# Patient Record
Sex: Male | Born: 1955
Health system: Southern US, Community
[De-identification: ages and names within clinical notes are randomized; demographics above are authoritative.]

## PROBLEM LIST (undated history)

## (undated) DIAGNOSIS — R911 Solitary pulmonary nodule: Secondary | ICD-10-CM

## (undated) DIAGNOSIS — B1920 Unspecified viral hepatitis C without hepatic coma: Secondary | ICD-10-CM

## (undated) DIAGNOSIS — N2889 Other specified disorders of kidney and ureter: Secondary | ICD-10-CM

## (undated) DIAGNOSIS — Z9889 Other specified postprocedural states: Secondary | ICD-10-CM

## (undated) DIAGNOSIS — B182 Chronic viral hepatitis C: Principal | ICD-10-CM

## (undated) DIAGNOSIS — F419 Anxiety disorder, unspecified: Secondary | ICD-10-CM

## (undated) DIAGNOSIS — N400 Enlarged prostate without lower urinary tract symptoms: Secondary | ICD-10-CM

## (undated) DIAGNOSIS — E785 Hyperlipidemia, unspecified: Secondary | ICD-10-CM

## (undated) DIAGNOSIS — M1990 Unspecified osteoarthritis, unspecified site: Secondary | ICD-10-CM

## (undated) DIAGNOSIS — R7302 Impaired glucose tolerance (oral): Secondary | ICD-10-CM

## (undated) HISTORY — DX: Benign prostatic hyperplasia without lower urinary tract symptoms: N40.0

## (undated) HISTORY — DX: Unspecified osteoarthritis, unspecified site: M19.90

## (undated) HISTORY — PX: KNEE ARTHROSCOPY: SHX127

## (undated) HISTORY — DX: Solitary pulmonary nodule: R91.1

## (undated) HISTORY — DX: Impaired glucose tolerance (oral): R73.02

## (undated) HISTORY — DX: Hyperlipidemia, unspecified: E78.5

## (undated) HISTORY — DX: Chronic viral hepatitis C: B18.2

## (undated) HISTORY — DX: Other specified postprocedural states: Z98.890

## (undated) HISTORY — DX: Unspecified viral hepatitis C without hepatic coma: B19.20

---

## 1997-10-07 ENCOUNTER — Emergency Department (HOSPITAL_COMMUNITY): Admission: EM | Admit: 1997-10-07 | Discharge: 1997-10-07 | Payer: Self-pay | Admitting: Emergency Medicine

## 1999-01-30 ENCOUNTER — Ambulatory Visit (HOSPITAL_COMMUNITY): Admission: RE | Admit: 1999-01-30 | Discharge: 1999-01-30 | Payer: Self-pay | Admitting: Gastroenterology

## 1999-01-30 ENCOUNTER — Encounter (INDEPENDENT_AMBULATORY_CARE_PROVIDER_SITE_OTHER): Payer: Self-pay | Admitting: Specialist

## 2001-11-19 ENCOUNTER — Emergency Department (HOSPITAL_COMMUNITY): Admission: EM | Admit: 2001-11-19 | Discharge: 2001-11-19 | Payer: Self-pay | Admitting: Emergency Medicine

## 2001-11-21 ENCOUNTER — Ambulatory Visit (HOSPITAL_COMMUNITY): Admission: RE | Admit: 2001-11-21 | Discharge: 2001-11-21 | Payer: Self-pay | Admitting: Gastroenterology

## 2002-04-12 ENCOUNTER — Emergency Department (HOSPITAL_COMMUNITY): Admission: EM | Admit: 2002-04-12 | Discharge: 2002-04-12 | Payer: Self-pay

## 2004-02-01 ENCOUNTER — Emergency Department (HOSPITAL_COMMUNITY): Admission: EM | Admit: 2004-02-01 | Discharge: 2004-02-01 | Payer: Self-pay | Admitting: Emergency Medicine

## 2004-05-19 ENCOUNTER — Ambulatory Visit (HOSPITAL_COMMUNITY): Admission: RE | Admit: 2004-05-19 | Discharge: 2004-05-19 | Payer: Self-pay | Admitting: Specialist

## 2004-06-08 ENCOUNTER — Ambulatory Visit (HOSPITAL_BASED_OUTPATIENT_CLINIC_OR_DEPARTMENT_OTHER): Admission: RE | Admit: 2004-06-08 | Discharge: 2004-06-08 | Payer: Self-pay | Admitting: Specialist

## 2007-07-28 ENCOUNTER — Emergency Department (HOSPITAL_COMMUNITY): Admission: EM | Admit: 2007-07-28 | Discharge: 2007-07-28 | Payer: Self-pay | Admitting: Emergency Medicine

## 2007-08-20 ENCOUNTER — Ambulatory Visit (HOSPITAL_COMMUNITY): Admission: RE | Admit: 2007-08-20 | Discharge: 2007-08-20 | Payer: Self-pay | Admitting: Gastroenterology

## 2007-08-20 ENCOUNTER — Encounter (INDEPENDENT_AMBULATORY_CARE_PROVIDER_SITE_OTHER): Payer: Self-pay | Admitting: Gastroenterology

## 2007-11-22 ENCOUNTER — Emergency Department (HOSPITAL_COMMUNITY): Admission: EM | Admit: 2007-11-22 | Discharge: 2007-11-22 | Payer: Self-pay | Admitting: Emergency Medicine

## 2007-12-02 ENCOUNTER — Encounter (INDEPENDENT_AMBULATORY_CARE_PROVIDER_SITE_OTHER): Payer: Self-pay | Admitting: Orthopedic Surgery

## 2007-12-02 ENCOUNTER — Ambulatory Visit (HOSPITAL_BASED_OUTPATIENT_CLINIC_OR_DEPARTMENT_OTHER): Admission: RE | Admit: 2007-12-02 | Discharge: 2007-12-02 | Payer: Self-pay | Admitting: Orthopedic Surgery

## 2008-10-20 ENCOUNTER — Emergency Department (HOSPITAL_COMMUNITY): Admission: EM | Admit: 2008-10-20 | Discharge: 2008-10-20 | Payer: Self-pay | Admitting: Family Medicine

## 2010-08-15 NOTE — Op Note (Signed)
Adam [name], Adam [name]              ACCOUNT NO.:  [id number]   MEDICAL RECORD NO.:  [id number]          PATIENT TYPE:  AMB   LOCATION:  DSC                          FACILITY:  [facility]   PHYSICIAN:  Cindee Salt, M.D.       DATE OF BIRTH:  Mar 14, 1956   DATE OF PROCEDURE:  12/02/2007  DATE OF DISCHARGE:                               OPERATIVE REPORT   PREOPERATIVE DIAGNOSIS:  Status post laceration, right middle finger  metacarpophalangeal joint area.   POSTOPERATIVE DIAGNOSIS:  Status post laceration, right middle finger  metacarpophalangeal joint area.   OPERATION:  Incision and drainage, debridement of laceration, right  middle finger.   SURGEON:  Cindee Salt, MD   ANESTHESIA:  Forearm based IV regional.   HISTORY:  The patient is a 55 year old male who suffered a laceration  over the dorsal aspect of his right hand, metacarpophalangeal joint  middle finger approximately 2 weeks ago.  He has had progressive  swelling of the area and pain with extension.  He is admitted now for  exploration of the extensor tendon and debridement.  He is well aware of  risks and complications including infection, recurrence of injury to  arteries, nerves, tendons, complete relief of symptoms, dystrophy,  scarring in the area, and possibility of damage to the joint.  In the  preoperative area, the patient is seen.  The extremity marked by both  the patient and surgeon and antibiotic given.   PROCEDURE:  The patient was brought to the operating room where a  forearm based IV regional anesthetic was carried out without difficulty  under the direction of Dr. Jean Rosenthal.  He was prepped using DuraPrep,  supine position with the right arm free.  The limb was exsanguinated  with an Esmarch bandage and tourniquet having been inflated.  The time-  out was taken.  A curvilinear incision was made over the dorsal aspect  of the metacarpal, carried onto the proximal phalanx, carried down  through subcutaneous  tissue.  No gross purulence was encountered.  The  extensor tendon was noted.  There was significant granulation tissue  present dorsal to this.  This was a markedly thickened caseous looking  heavy granulation-type tissue.  This was debrided.  Cultures were taken  for both aerobic, anaerobic, fungal, and atypical mycobacterial  infection.  Biopsies were taken.  This was sent for regular pathology  and for culture.  The area was irrigated.  The extensor tendon was  entirely intact and this did not enter into the joint.  The wound was  irrigated.  The skin closed with interrupted 4-0 Vicryl Rapide sutures.  Sterile compressive dressing and splint to the hand applied.  The  patient tolerated the procedure well and was taken to the recovery room  for observation in satisfactory condition.  He will be discharged home  to return to the [facility] in 1 week on Vicodin and  doxycycline.           ______________________________  Cindee Salt, M.D.     GK/MEDQ  D:  12/02/2007  T:  12/03/2007  Job:  [id number] 

## 2010-08-15 NOTE — Op Note (Signed)
[name], Adam              ACCOUNT NO.:  [id number]   MEDICAL RECORD NO.:  [id number]          PATIENT TYPE:  AMB   LOCATION:  ENDO                         FACILITY:  [facility]   PHYSICIAN:  Anselmo Rod, M.D.  DATE OF BIRTH:  01/27/1956   DATE OF PROCEDURE:  08/20/2007  DATE OF DISCHARGE:  08/20/2007                               OPERATIVE REPORT   PROCEDURE PERFORMED:  Colonoscopy with cold biopsies x 3.   ENDOSCOPIST:  Anselmo Rod, M.D.   INSTRUMENT USED:  Pentax video colonoscope.   INDICATIONS FOR PROCEDURE:  55 year old male undergoing screening  colonoscopy to rule out colonic polyps, masses, etc.   PREPROCEDURE PREPARATION:  Informed consent was procured from the  patient.  The patient fasted for 8 hours prior to the procedure and  prepped with a bottle of magnesium citrate and a gallon of NuLytely the  night prior to the procedure.  The risks and benefits of the procedure  including a 10% miss rate of cancer and polyps were discussed with the  patient as well.   PREPROCEDURE PHYSICAL:  The patient had stable vital signs.  Neck  supple.  Chest clear to auscultation.  S1, S2 regular.  Abdomen soft  with normal bowel sounds.   DESCRIPTION OF PROCEDURE:  The patient was placed in left lateral  decubitus position and sedated with 75 mcg of Fentanyl and 7 mg of  Versed given intravenously in slow incremental doses. Once the patient  was adequately sedated and maintained on low-flow oxygen and continuous  cardiac monitoring, the Pentax video colonoscope was advanced from the  rectum to cecum.  The appendiceal orifice and ileocecal valve were  clearly visualized and photographed.  The terminal ileum appeared  healthy without lesions.  A small sessile polyp was removed from the  cecum by cold biopsies x1.  Another small sessile polyp was removed from  the rectum with cold biopsies x2.  Small internal hemorrhoids were seen  on retroflexion.  The rest of the exam was  unremarkable.  The patient  tolerated the procedure well without immediate complications.   IMPRESSION:  1. Small internal hemorrhoids.  2. Small sessile polyp removed by cold biopsies from the rectum.  3. Small sessile polyp removed from the cecum with cold biopsies x 1.  4. Otherwise normal exam.  No evidence of diverticulosis.   RECOMMENDATIONS:  1. Await pathology results.  2. Avoid all nonsteroidals including aspirin for the next 3 weeks.  3. Repeat colonoscopy depending on pathology results.  4. Outpatient follow-up as need arises in the future.      Anselmo Rod, M.D.  Electronically Signed     JNM/MEDQ  D:  08/25/2007  T:  08/26/2007  Job:  [id number]   cc:   Donia Guiles, M.D.  Fax: [phone]

## 2010-08-18 NOTE — Op Note (Signed)
NAMEDELDRICK, Adam [name]              ACCOUNT NO.:  [id number]   MEDICAL RECORD NO.:  [id number]          PATIENT TYPE:  AMB   LOCATION:  NESC                         FACILITY:  [facility]   PHYSICIAN:  Jene Every, M.D.    DATE OF BIRTH:  1955-04-19   DATE OF PROCEDURE:  06/08/2004  DATE OF DISCHARGE:                                 OPERATIVE REPORT   PREOPERATIVE DIAGNOSIS:  Medial meniscus tear with spotty chondromalacia,  right knee.   POSTOPERATIVE DIAGNOSIS:  1.  Grade 3 chondromalacia of the medial femoral condyle, tibial plateau.  2.  Small grade 4 lesion of the tibial plateau medially.  3.  Medial meniscus tear with grade 3 chondromalacia of the patella.   PROCEDURE:  1.  Right knee arthroscopy.  2.  Partial medial meniscectomy.  3.  Removal of loose body.  4.  Chondroplasty of the medial femoral condyle, tibial plateau, and      patella.   ANESTHESIA:  General.   SURGEON:  Jene Every, M.D.   ASSISTANT:  None.   INDICATIONS FOR PROCEDURE:  This is a 55 year old with refractory right knee  pain, locking, and giving way.  MRI indicating meniscus tear, loose body.  Operative intervention is indicated for diagnosis and treatment by  diagnostic arthroscopy.  The risks and benefits were discussed, including  bleeding, infection, damage to neurovascular structures, no change in  symptoms, worsening of symptoms, need for repeat debridement in the future,  etc.   DESCRIPTION OF PROCEDURE:  With the patient in the supine position after  induction of general anesthesia and 1 g of Kefzol, the right lower extremity  was prepped and draped in the usual sterile fashion.   A lateral parapatellar portal and superomedial parapatellar portal was  fashioned with a #11 blade.  Ingress cannula atraumatically placed.  Irrigant was utilized to insufflate the joint.  Arthroscopic camera then  inserted laterally.  Inspection of the medial compartment revealed extensive  grade 3 chondral  flap tears and degenerative changes of the medial femoral  condyle.  A shaver was introduced after a medial parapatellar portal was  fashioned with a #11 blade and localized with an 18-gauge needle, sparring  the medial meniscus.  Chondroplasty of the medial femoral condyle was  performed.  A complex tear of the posterior portion of the medial meniscus  was identified and resected with a straight basket rongeur and further  contoured to a stable base with the 42 Kuda shaver.  The remnant,  approximately 50%, was stable to probe or palpation.  There was a small  grade 4 lesion over the posterior tibial plateau on the nonweightbearing  section, and immediately beside that underneath the meniscus, a small loose  body was retrieved with the pituitary approximately a centimeter in  diameter.  Cartilaginous in nature.  The remnant was stable to probe and  palpation.  The chondroplasty had been performed.  Next, examination of the  ACL and PCL revealed that they were unremarkable.  The lateral compartment  was essentially unremarkable.  There were some minor grade 2 changes of the  femoral condyle and  tibial plateau.  The meniscus was stable to probe and  palpation, without evidence of a tear.  There were grade 3 changes of the  patella.  There was normal patellofemoral tracking, and we performed a  chondroplasty of the patella as well.  The gutters were unremarkable as  well.  We copiously lavaged the knee and reexamined all compartments  specifically, as well, the medial compartment again.  The remnant meniscus  was stable to probe and palpation, without evidence of additional pathology  amenable to arthroscopic intervention.  We copiously lavaged the knee.  All  instrumentation was removed.  The portals were closed with 4-0 nylon simple  suture.  Marcaine 0.25% with epinephrine was infiltrated in the joint.  The  wound was dressed sterilely.   He was awakened without difficulty and transported  to recovery in  satisfactory condition.  The patient tolerated the procedure well. There  were no complications.      JB/MEDQ  D:  06/08/2004  T:  06/08/2004  Job:  [id number]

## 2011-01-03 LAB — FUNGUS CULTURE W SMEAR

## 2011-01-03 LAB — AFB CULTURE WITH SMEAR (NOT AT ARMC)
Acid Fast Smear: NONE SEEN
Acid Fast Smear: NONE SEEN

## 2011-01-03 LAB — TISSUE CULTURE: Gram Stain: NONE SEEN

## 2011-01-03 LAB — ANAEROBIC CULTURE: Gram Stain: NONE SEEN

## 2011-01-03 LAB — POCT HEMOGLOBIN-HEMACUE: Hemoglobin: 15.3

## 2013-01-17 ENCOUNTER — Emergency Department (HOSPITAL_COMMUNITY)
Admission: EM | Admit: 2013-01-17 | Discharge: 2013-01-17 | Disposition: A | Discharge status: Home / Self Care | Payer: 59 | Attending: Emergency Medicine | Admitting: Emergency Medicine

## 2013-01-17 ENCOUNTER — Emergency Department (HOSPITAL_COMMUNITY): Payer: 59

## 2013-01-17 ENCOUNTER — Encounter (HOSPITAL_COMMUNITY): Payer: Self-pay | Admitting: Emergency Medicine

## 2013-01-17 DIAGNOSIS — Z23 Encounter for immunization: Secondary | ICD-10-CM | POA: Insufficient documentation

## 2013-01-17 DIAGNOSIS — S43101A Unspecified dislocation of right acromioclavicular joint, initial encounter: Secondary | ICD-10-CM

## 2013-01-17 DIAGNOSIS — S43109A Unspecified dislocation of unspecified acromioclavicular joint, initial encounter: Secondary | ICD-10-CM | POA: Insufficient documentation

## 2013-01-17 DIAGNOSIS — IMO0002 Reserved for concepts with insufficient information to code with codable children: Secondary | ICD-10-CM | POA: Insufficient documentation

## 2013-01-17 DIAGNOSIS — Y92410 Unspecified street and highway as the place of occurrence of the external cause: Secondary | ICD-10-CM | POA: Insufficient documentation

## 2013-01-17 DIAGNOSIS — Z880 Allergy status to penicillin: Secondary | ICD-10-CM | POA: Insufficient documentation

## 2013-01-17 DIAGNOSIS — Y9389 Activity, other specified: Secondary | ICD-10-CM | POA: Insufficient documentation

## 2013-01-17 MED ORDER — TETANUS-DIPHTH-ACELL PERTUSSIS 5-2.5-18.5 LF-MCG/0.5 IM SUSP
0.5000 mL | Freq: Once | INTRAMUSCULAR | Status: AC
  Administered 2013-01-17: 0.5 mL via INTRAMUSCULAR
  Filled 2013-01-17: qty 0.5

## 2013-01-17 MED ORDER — DIPHENHYDRAMINE HCL 50 MG/ML IJ SOLN
12.5000 mg | Freq: Once | INTRAMUSCULAR | Status: AC
  Administered 2013-01-17: 12.5 mg via INTRAVENOUS
  Filled 2013-01-17: qty 1

## 2013-01-17 MED ORDER — MORPHINE SULFATE 4 MG/ML IJ SOLN
4.0000 mg | INTRAMUSCULAR | Status: AC | PRN
  Administered 2013-01-17 (×3): 4 mg via INTRAVENOUS
  Filled 2013-01-17 (×3): qty 1

## 2013-01-17 MED ORDER — HYDROCODONE-ACETAMINOPHEN 5-325 MG PO TABS: 1.0000 | ORAL_TABLET | ORAL | Status: DC | PRN

## 2013-01-17 MED ORDER — ONDANSETRON HCL 4 MG/2ML IJ SOLN
4.0000 mg | Freq: Once | INTRAMUSCULAR | Status: AC | PRN
  Administered 2013-01-17: 4 mg via INTRAVENOUS
  Filled 2013-01-17: qty 2

## 2013-01-17 NOTE — ED Notes (Signed)
Pt reported swollen area above Iv site.  Skin is red , no itching . Pt does not report an ALL to Morphine. Reported to EDP Knapp.

## 2013-01-17 NOTE — Progress Notes (Signed)
Orthopedic Tech Progress Note Patient Details:  CLIFFARD HAIR 09-02-1955 [id number]  Ortho Devices Type of Ortho Device: Shoulder immobilizer;Thumb velcro splint Ortho Device/Splint Location: sling (RUE) LUE thumb spica Ortho Device/Splint Interventions: Ordered;Application   Jennye Moccasin 01/17/2013, 8:10 PM

## 2013-01-17 NOTE — ED Notes (Signed)
Patient returned from XR. 
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
## 2013-09-11 ENCOUNTER — Encounter (HOSPITAL_COMMUNITY): Payer: Self-pay | Admitting: Emergency Medicine

## 2013-09-11 ENCOUNTER — Emergency Department (HOSPITAL_COMMUNITY)
Admission: EM | Admit: 2013-09-11 | Discharge: 2013-09-12 | Disposition: A | Discharge status: Home / Self Care | Payer: 59 | Attending: Emergency Medicine | Admitting: Emergency Medicine

## 2013-09-11 DIAGNOSIS — Y9389 Activity, other specified: Secondary | ICD-10-CM | POA: Insufficient documentation

## 2013-09-11 DIAGNOSIS — Z880 Allergy status to penicillin: Secondary | ICD-10-CM | POA: Insufficient documentation

## 2013-09-11 DIAGNOSIS — S298XXA Other specified injuries of thorax, initial encounter: Secondary | ICD-10-CM | POA: Insufficient documentation

## 2013-09-11 DIAGNOSIS — W19XXXA Unspecified fall, initial encounter: Secondary | ICD-10-CM

## 2013-09-11 DIAGNOSIS — W1789XA Other fall from one level to another, initial encounter: Secondary | ICD-10-CM | POA: Insufficient documentation

## 2013-09-11 DIAGNOSIS — Y929 Unspecified place or not applicable: Secondary | ICD-10-CM | POA: Insufficient documentation

## 2013-09-11 DIAGNOSIS — R0781 Pleurodynia: Secondary | ICD-10-CM

## 2013-09-11 MED ORDER — OXYCODONE-ACETAMINOPHEN 5-325 MG PO TABS
2.0000 | ORAL_TABLET | Freq: Once | ORAL | Status: AC
  Administered 2013-09-11: 2 via ORAL
  Filled 2013-09-11: qty 2

## 2013-09-11 NOTE — ED Notes (Addendum)
Pt reports he fell over a 6 foot fence last night landing on his back. Pt reports pain in  L lateral ribs when he coughs. Denies loss of urine or bowel. Lung sounds clear in all fields. No distress noted at this time. Skin warm and dry.
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
## 2013-09-12 ENCOUNTER — Emergency Department (HOSPITAL_COMMUNITY): Payer: 59

## 2013-09-12 MED ORDER — OXYCODONE-ACETAMINOPHEN 5-325 MG PO TABS: 1.0000 | ORAL_TABLET | Freq: Four times a day (QID) | ORAL | Status: DC | PRN

## 2013-09-12 NOTE — Discharge Instructions (Signed)
Take pain medication as needed for pain.  Do not drive or operate heavy machinery for 4 hours after taking the medication.

## 2013-09-14 NOTE — ED Provider Notes (Signed)
Medical screening examination/treatment/procedure(s) were performed by non-physician practitioner and as supervising physician I was immediately available for consultation/collaboration.   EKG Interpretation None        Osvaldo Shipper, MD 09/14/13 [phone]

## 2014-06-28 ENCOUNTER — Other Ambulatory Visit (HOSPITAL_COMMUNITY): Payer: Self-pay | Admitting: Nurse Practitioner

## 2014-06-28 DIAGNOSIS — B182 Chronic viral hepatitis C: Secondary | ICD-10-CM

## 2014-07-15 ENCOUNTER — Ambulatory Visit (HOSPITAL_COMMUNITY)
Admission: RE | Admit: 2014-07-15 | Discharge: 2014-07-15 | Disposition: A | Discharge status: Home / Self Care | Payer: 59 | Source: Ambulatory Visit | Attending: Nurse Practitioner | Admitting: Nurse Practitioner

## 2014-07-15 DIAGNOSIS — B182 Chronic viral hepatitis C: Secondary | ICD-10-CM | POA: Insufficient documentation

## 2014-07-15 DIAGNOSIS — K8020 Calculus of gallbladder without cholecystitis without obstruction: Secondary | ICD-10-CM | POA: Insufficient documentation

## 2014-08-16 ENCOUNTER — Ambulatory Visit (INDEPENDENT_AMBULATORY_CARE_PROVIDER_SITE_OTHER): Payer: 59 | Admitting: Infectious Disease

## 2014-08-16 ENCOUNTER — Encounter: Payer: Self-pay | Admitting: Infectious Disease

## 2014-08-16 VITALS — BP 145/91 | HR 74 | Temp 98.1°F | Ht 73.0 in | Wt 167.0 lb

## 2014-08-16 DIAGNOSIS — Z23 Encounter for immunization: Secondary | ICD-10-CM

## 2014-08-16 DIAGNOSIS — Z9889 Other specified postprocedural states: Secondary | ICD-10-CM

## 2014-08-16 DIAGNOSIS — B182 Chronic viral hepatitis C: Secondary | ICD-10-CM | POA: Diagnosis not present

## 2014-08-16 DIAGNOSIS — E785 Hyperlipidemia, unspecified: Secondary | ICD-10-CM

## 2014-08-16 DIAGNOSIS — I10 Essential (primary) hypertension: Secondary | ICD-10-CM

## 2014-08-16 HISTORY — DX: Other specified postprocedural states: Z98.890

## 2014-08-16 HISTORY — DX: Chronic viral hepatitis C: B18.2

## 2014-08-16 HISTORY — DX: Hyperlipidemia, unspecified: E78.5

## 2014-08-16 MED ORDER — LEDIPASVIR-SOFOSBUVIR 90-400 MG PO TABS: 1.0000 | ORAL_TABLET | Freq: Every day | ORAL | Status: DC

## 2014-08-16 MED ORDER — RIBAVIRIN 200 MG PO CAPS: 600.0000 mg | ORAL_CAPSULE | Freq: Every day | ORAL | Status: DC

## 2014-08-16 NOTE — Addendum Note (Signed)
Addended by: Landis Gandy on: 08/16/2014 04:06 PM   Modules accepted: Orders

## 2014-08-16 NOTE — Progress Notes (Signed)
Patient ID: Adam [name], male   DOB: 1955-10-19, 59 y.o.   MRN: [id number] HPI: Adam [name] is a 59 y.o. male who is here to be seen for his hep C before he can get his meds.   No results found for: HCVGENOTYPE, HEPCGENOTYPE  Allergies: No Active Allergies  Vitals: Temp: 98.1 F (36.7 C) (05/16 1110) Temp Source: Oral (05/16 1110) BP: 145/91 mmHg (05/16 1110) Pulse Rate: 74 (05/16 1110)  Past Medical History: Past Medical History  Diagnosis Date  . Chronic hepatitis C without hepatic coma 08/16/2014  . HTN (hypertension) 08/16/2014  . H/O knee surgery 08/16/2014    Social History: History   Social History  . Marital Status: Married    Spouse Name: N/A  . Number of Children: N/A  . Years of Education: N/A   Social History Main Topics  . Smoking status: Never Smoker   . Smokeless tobacco: Never Used  . Alcohol Use: No  . Drug Use: Yes    Special: Marijuana  . Sexual Activity: Yes   Other Topics Concern  . None   Social History Narrative    Labs: No results found for: HIV1RNAQUANT, HIV1RNAVL, CD4TABS, HEPBSAB, HEPBSAG, HCVAB  No results found for: HCVGENOTYPE, HEPCGENOTYPE  No flowsheet data found.  No results found for: AST, ALT, INR  CrCl: CrCl cannot be calculated (Patient has no serum creatinine result on file.).  Fibrosis Score: F2/3 as assessed by [facility]  Child-Pugh Score:   Previous Treatment Regimen: Interferon x 48 wks  Assessment: Pt was seen by [facility] liver clinic for his Hep C. He denied IV drug use and ETOH. He had both Fibrosure (F3) and Elastography (F2/3) done. He started that he was on interferon x 48 weeks without riba around 2006/2007. He didn't get cure from it. He prescribed Harvoni x 12 wks but we are going to add low dose ribavirin since he is treatment experience even if the guideline doesn't require it. Adding low dose ribavirin will increase the response rate even more. He is totally agree to do it.    Recommendations: Harvoni 1 PO qday Ribavirin 600mg  PO qday He is going to f/u with [facility] clinic upstairs  Onnie Boer [place], [place].D., BCPS, AAHIVP Clinical Infectious [place] [facility] 08/16/2014, 11:56 AM

## 2014-08-16 NOTE — Progress Notes (Signed)
   Subjective:    Patient ID: Adam [name], male    DOB: 1955-06-24, 59 y.o.   MRN: [id number]  HPI  59 year old with Chronic hepatitis C without hepatic coma who had liver biopsy in 2000 with firbrosis score F1, genotype 1a.   He was treated apparently with only IFN in 2007 and failed  He has been seen by [place] from [facility] and was found by Fibrosure to be F2-F3 and via elastography to be F2-F3.  He has been given rx for Harvoni but being a [employer] employee he will benefit from being seen here once and to have the script written by [facility] provider and meds filled at [facility].    Review of Systems  Constitutional: Negative for fever, chills, diaphoresis, activity change, appetite change, fatigue and unexpected weight change.  HENT: Negative for congestion, rhinorrhea, sinus pressure, sneezing, sore throat and trouble swallowing.   Eyes: Negative for photophobia and visual disturbance.  Respiratory: Negative for cough, chest tightness, shortness of breath, wheezing and stridor.   Cardiovascular: Negative for chest pain, palpitations and leg swelling.  Gastrointestinal: Negative for nausea, vomiting, abdominal pain, diarrhea, constipation, blood in stool, abdominal distention and anal bleeding.  Genitourinary: Negative for dysuria, hematuria, flank pain and difficulty urinating.  Musculoskeletal: Negative for myalgias, back pain, joint swelling, arthralgias and gait problem.  Skin: Negative for color change, pallor, rash and wound.  Neurological: Negative for dizziness, tremors, weakness and light-headedness.  Hematological: Negative for adenopathy. Does not bruise/bleed easily.  Psychiatric/Behavioral: Negative for behavioral problems, confusion, sleep disturbance, dysphoric mood, decreased concentration and agitation.       Objective:   Physical Exam  Constitutional: He is oriented to person, place, and time. He appears well-developed and  well-nourished.  HENT:  Head: Normocephalic and atraumatic.  Eyes: Conjunctivae and EOM are normal.  Neck: Normal range of motion. Neck supple.  Cardiovascular: Normal rate and regular rhythm.   Pulmonary/Chest: Effort normal. No respiratory distress. He has no wheezes.  Abdominal: Soft. He exhibits no distension.  Musculoskeletal: Normal range of motion. He exhibits no edema or tenderness.  Neurological: He is alert and oriented to person, place, and time.  Skin: Skin is warm and dry. No rash noted. No erythema. No pallor.  Psychiatric: He has a normal mood and affect. His behavior is normal. Judgment and thought content normal.          Assessment & Plan:   Chronic hepatitis C without hepatic coma genotype 1b, F2-F3 by elastography. Reviewed case with pt and MInh Pham our pharmacist and we decided ultimatley to add ribavirin to his Harvoni in case his liver disease is worse than it seems by elastography and he actually has cirrhosis  Rx sent to [facility]. H e needs Hep B vax so we gave him #1 here. I do not know if he has Hep A need for vax. He states he was tested HIV negative by Hepatology  I spent greater than 45 minutes with the patient including greater than 50% of time in face to face counsel of the patient re how to take meds, condition of his liver  and in coordination of their care with pharmacy  HTN: bp a bit, will defer to PCP  Hyperlipidemia; on statin and followed by PCP

## 2014-09-16 ENCOUNTER — Other Ambulatory Visit: Payer: 59

## 2014-09-16 ENCOUNTER — Ambulatory Visit: Payer: 59

## 2014-10-06 ENCOUNTER — Ambulatory Visit: Payer: 59 | Admitting: Infectious Disease

## 2015-02-16 ENCOUNTER — Ambulatory Visit: Payer: 59

## 2015-02-17 ENCOUNTER — Ambulatory Visit: Payer: 59

## 2015-05-02 DIAGNOSIS — B182 Chronic viral hepatitis C: Secondary | ICD-10-CM | POA: Diagnosis not present

## 2015-05-06 DIAGNOSIS — B182 Chronic viral hepatitis C: Secondary | ICD-10-CM | POA: Diagnosis not present

## 2015-05-16 MED FILL — ATORVASTATIN 20 MG TABLET: 20 | 90 days supply | Qty: 90 | Fill #1

## 2015-06-02 DIAGNOSIS — R972 Elevated prostate specific antigen [PSA]: Secondary | ICD-10-CM | POA: Diagnosis not present

## 2015-06-02 DIAGNOSIS — E7800 Pure hypercholesterolemia, unspecified: Secondary | ICD-10-CM | POA: Diagnosis not present

## 2015-06-02 DIAGNOSIS — Z0000 Encounter for general adult medical examination without abnormal findings: Secondary | ICD-10-CM | POA: Diagnosis not present

## 2015-06-02 DIAGNOSIS — Z8619 Personal history of other infectious and parasitic diseases: Secondary | ICD-10-CM | POA: Diagnosis not present

## 2015-06-16 DIAGNOSIS — R351 Nocturia: Secondary | ICD-10-CM | POA: Diagnosis not present

## 2015-06-16 DIAGNOSIS — R3129 Other microscopic hematuria: Secondary | ICD-10-CM | POA: Diagnosis not present

## 2015-06-16 DIAGNOSIS — R972 Elevated prostate specific antigen [PSA]: Secondary | ICD-10-CM | POA: Diagnosis not present

## 2015-06-16 DIAGNOSIS — Z0000 Encounter for general adult medical examination without abnormal findings: Secondary | ICD-10-CM | POA: Diagnosis not present

## 2015-06-16 DIAGNOSIS — N401 Enlarged prostate with lower urinary tract symptoms: Secondary | ICD-10-CM | POA: Diagnosis not present

## 2015-08-11 ENCOUNTER — Encounter (HOSPITAL_COMMUNITY): Payer: Self-pay | Admitting: Emergency Medicine

## 2015-08-11 ENCOUNTER — Observation Stay (HOSPITAL_COMMUNITY)
Admission: EM | Admit: 2015-08-11 | Discharge: 2015-08-13 | Disposition: A | Discharge status: Home / Self Care | Payer: 59 | Attending: General Surgery | Admitting: General Surgery

## 2015-08-11 DIAGNOSIS — I10 Essential (primary) hypertension: Secondary | ICD-10-CM | POA: Diagnosis not present

## 2015-08-11 DIAGNOSIS — D72829 Elevated white blood cell count, unspecified: Secondary | ICD-10-CM | POA: Diagnosis not present

## 2015-08-11 DIAGNOSIS — M1990 Unspecified osteoarthritis, unspecified site: Secondary | ICD-10-CM | POA: Insufficient documentation

## 2015-08-11 DIAGNOSIS — E785 Hyperlipidemia, unspecified: Secondary | ICD-10-CM | POA: Diagnosis not present

## 2015-08-11 DIAGNOSIS — B182 Chronic viral hepatitis C: Secondary | ICD-10-CM | POA: Diagnosis not present

## 2015-08-11 DIAGNOSIS — E876 Hypokalemia: Secondary | ICD-10-CM | POA: Diagnosis not present

## 2015-08-11 DIAGNOSIS — Z791 Long term (current) use of non-steroidal anti-inflammatories (NSAID): Secondary | ICD-10-CM | POA: Insufficient documentation

## 2015-08-11 DIAGNOSIS — K8010 Calculus of gallbladder with chronic cholecystitis without obstruction: Secondary | ICD-10-CM | POA: Diagnosis not present

## 2015-08-11 DIAGNOSIS — Z79899 Other long term (current) drug therapy: Secondary | ICD-10-CM | POA: Insufficient documentation

## 2015-08-11 DIAGNOSIS — K810 Acute cholecystitis: Secondary | ICD-10-CM

## 2015-08-11 DIAGNOSIS — R109 Unspecified abdominal pain: Secondary | ICD-10-CM | POA: Diagnosis present

## 2015-08-11 LAB — COMPREHENSIVE METABOLIC PANEL
ALBUMIN: 4.2 g/dL (ref 3.5–5.0)
ALT: 17 U/L (ref 17–63)
ANION GAP: 16 — AB (ref 5–15)
AST: 23 U/L (ref 15–41)
Alkaline Phosphatase: 78 U/L (ref 38–126)
BUN: 9 mg/dL (ref 6–20)
CHLORIDE: 101 mmol/L (ref 101–111)
CO2: 22 mmol/L (ref 22–32)
Calcium: 9.7 mg/dL (ref 8.9–10.3)
Creatinine, Ser: 0.95 mg/dL (ref 0.61–1.24)
GFR calc Af Amer: >60 mL/min (ref >60)
GFR calc non Af Amer: >60 mL/min (ref >60)
GLUCOSE: 156 mg/dL — AB (ref 65–99)
POTASSIUM: 3.4 mmol/L — AB (ref 3.5–5.1)
SODIUM: 139 mmol/L (ref 135–145)
Total Bilirubin: 1 mg/dL (ref 0.3–1.2)
Total Protein: 8.9 g/dL — ABNORMAL HIGH (ref 6.5–8.1)

## 2015-08-11 LAB — CBC
HEMATOCRIT: 43.8 % (ref 39.0–52.0)
HEMOGLOBIN: 14.5 g/dL (ref 13.0–17.0)
MCH: 25.3 pg — ABNORMAL LOW (ref 26.0–34.0)
MCHC: 33.1 g/dL (ref 30.0–36.0)
MCV: 76.3 fL — AB (ref 78.0–100.0)
Platelets: 217 10*3/uL (ref 150–400)
RBC: 5.74 MIL/uL (ref 4.22–5.81)
RDW: 13.9 % (ref 11.5–15.5)
WBC: 15.1 10*3/uL — ABNORMAL HIGH (ref 4.0–10.5)

## 2015-08-11 LAB — URINE MICROSCOPIC-ADD ON

## 2015-08-11 LAB — URINALYSIS, ROUTINE W REFLEX MICROSCOPIC
BILIRUBIN URINE: NEGATIVE
GLUCOSE, UA: NEGATIVE mg/dL
KETONES UR: NEGATIVE mg/dL
Leukocytes, UA: NEGATIVE
NITRITE: NEGATIVE
Protein, ur: NEGATIVE mg/dL
SPECIFIC GRAVITY, URINE: 1.02 (ref 1.005–1.030)
pH: 6.5 (ref 5.0–8.0)

## 2015-08-11 LAB — LIPASE, BLOOD: LIPASE: 50 U/L (ref 11–51)

## 2015-08-11 NOTE — ED Notes (Signed)
Pt. reports mid abdominal pain onset 3 am this morning with emesis , denies diarrhea or fever .

## 2015-08-12 ENCOUNTER — Observation Stay (HOSPITAL_COMMUNITY): Payer: 59 | Admitting: Anesthesiology

## 2015-08-12 ENCOUNTER — Emergency Department (HOSPITAL_COMMUNITY): Payer: 59

## 2015-08-12 ENCOUNTER — Encounter (HOSPITAL_COMMUNITY): Payer: Self-pay | Admitting: Anesthesiology

## 2015-08-12 ENCOUNTER — Encounter (HOSPITAL_COMMUNITY)
Admission: EM | Disposition: A | Discharge status: Home / Self Care | Payer: Self-pay | Source: Home / Self Care | Attending: Emergency Medicine

## 2015-08-12 DIAGNOSIS — Z79899 Other long term (current) drug therapy: Secondary | ICD-10-CM | POA: Diagnosis not present

## 2015-08-12 DIAGNOSIS — D72829 Elevated white blood cell count, unspecified: Secondary | ICD-10-CM | POA: Diagnosis not present

## 2015-08-12 DIAGNOSIS — K810 Acute cholecystitis: Secondary | ICD-10-CM | POA: Diagnosis present

## 2015-08-12 DIAGNOSIS — K8010 Calculus of gallbladder with chronic cholecystitis without obstruction: Secondary | ICD-10-CM | POA: Diagnosis not present

## 2015-08-12 DIAGNOSIS — B182 Chronic viral hepatitis C: Secondary | ICD-10-CM | POA: Diagnosis not present

## 2015-08-12 DIAGNOSIS — R1084 Generalized abdominal pain: Secondary | ICD-10-CM | POA: Diagnosis not present

## 2015-08-12 DIAGNOSIS — E876 Hypokalemia: Secondary | ICD-10-CM | POA: Diagnosis not present

## 2015-08-12 DIAGNOSIS — M1990 Unspecified osteoarthritis, unspecified site: Secondary | ICD-10-CM | POA: Diagnosis not present

## 2015-08-12 DIAGNOSIS — K8020 Calculus of gallbladder without cholecystitis without obstruction: Secondary | ICD-10-CM | POA: Diagnosis not present

## 2015-08-12 DIAGNOSIS — E785 Hyperlipidemia, unspecified: Secondary | ICD-10-CM | POA: Diagnosis not present

## 2015-08-12 DIAGNOSIS — I10 Essential (primary) hypertension: Secondary | ICD-10-CM | POA: Diagnosis not present

## 2015-08-12 DIAGNOSIS — Z791 Long term (current) use of non-steroidal anti-inflammatories (NSAID): Secondary | ICD-10-CM | POA: Diagnosis not present

## 2015-08-12 HISTORY — PX: CHOLECYSTECTOMY: SHX55

## 2015-08-12 SURGERY — LAPAROSCOPIC CHOLECYSTECTOMY WITH INTRAOPERATIVE CHOLANGIOGRAM
Anesthesia: General | Site: Abdomen

## 2015-08-12 MED ORDER — IOPAMIDOL (ISOVUE-300) INJECTION 61%
INTRAVENOUS | Status: AC
  Administered 2015-08-12: 100 mL
  Filled 2015-08-12: qty 100

## 2015-08-12 MED ORDER — ONDANSETRON 4 MG PO TBDP
4.0000 mg | ORAL_TABLET | Freq: Four times a day (QID) | ORAL | Status: DC | PRN
  Administered 2015-08-12: 4 mg via ORAL
  Filled 2015-08-12: qty 1

## 2015-08-12 MED ORDER — DEXTROSE 5 % IV SOLN
2.0000 g | INTRAVENOUS | Status: DC
  Administered 2015-08-12 – 2015-08-13 (×2): 2 g via INTRAVENOUS
  Filled 2015-08-12 (×2): qty 2

## 2015-08-12 MED ORDER — DEXTROSE-NACL 5-0.9 % IV SOLN
INTRAVENOUS | Status: DC
  Administered 2015-08-12: 13:00:00 via INTRAVENOUS
  Administered 2015-08-13: 1 mL via INTRAVENOUS

## 2015-08-12 MED ORDER — ATORVASTATIN CALCIUM 10 MG PO TABS: 10.0000 mg | ORAL_TABLET | Freq: Every day | ORAL | Status: DC

## 2015-08-12 MED ORDER — IBUPROFEN 400 MG PO TABS: 400.0000 mg | ORAL_TABLET | Freq: Four times a day (QID) | ORAL | Status: DC | PRN

## 2015-08-12 MED ORDER — PROPOFOL 10 MG/ML IV BOLUS
INTRAVENOUS | Status: DC | PRN
  Administered 2015-08-12: 150 mg via INTRAVENOUS

## 2015-08-12 MED ORDER — ONDANSETRON HCL 4 MG/2ML IJ SOLN
INTRAMUSCULAR | Status: DC | PRN
Start: 2015-08-12 — End: 2015-08-12
  Administered 2015-08-12: 4 mg via INTRAVENOUS

## 2015-08-12 MED ORDER — DIPHENHYDRAMINE HCL 50 MG/ML IJ SOLN: 12.5000 mg | Freq: Four times a day (QID) | INTRAMUSCULAR | Status: DC | PRN

## 2015-08-12 MED ORDER — ONDANSETRON 4 MG PO TBDP
8.0000 mg | ORAL_TABLET | Freq: Once | ORAL | Status: AC
  Administered 2015-08-12: 8 mg via ORAL
  Filled 2015-08-12: qty 2

## 2015-08-12 MED ORDER — ONDANSETRON HCL 4 MG/2ML IJ SOLN: 4.0000 mg | Freq: Once | INTRAMUSCULAR | Status: DC | PRN

## 2015-08-12 MED ORDER — FENTANYL CITRATE (PF) 100 MCG/2ML IJ SOLN: 25.0000 ug | INTRAMUSCULAR | Status: DC | PRN

## 2015-08-12 MED ORDER — DIPHENHYDRAMINE HCL 12.5 MG/5ML PO ELIX: 12.5000 mg | ORAL_SOLUTION | Freq: Four times a day (QID) | ORAL | Status: DC | PRN

## 2015-08-12 MED ORDER — DOCUSATE SODIUM 100 MG PO CAPS: 100.0000 mg | ORAL_CAPSULE | Freq: Two times a day (BID) | ORAL | Status: DC | PRN

## 2015-08-12 MED ORDER — ONDANSETRON HCL 4 MG/2ML IJ SOLN: 4.0000 mg | Freq: Four times a day (QID) | INTRAMUSCULAR | Status: DC | PRN

## 2015-08-12 MED ORDER — PHENYLEPHRINE HCL 10 MG/ML IJ SOLN
INTRAMUSCULAR | Status: DC | PRN
  Administered 2015-08-12 (×2): 80 ug via INTRAVENOUS

## 2015-08-12 MED ORDER — MORPHINE SULFATE (PF) 4 MG/ML IV SOLN
4.0000 mg | Freq: Once | INTRAVENOUS | Status: AC
  Administered 2015-08-12: 4 mg via INTRAVENOUS
  Filled 2015-08-12: qty 1

## 2015-08-12 MED ORDER — HYDROMORPHONE HCL 1 MG/ML IJ SOLN: 1.0000 mg | INTRAMUSCULAR | Status: DC | PRN

## 2015-08-12 MED ORDER — LIDOCAINE HCL (CARDIAC) 20 MG/ML IV SOLN
INTRAVENOUS | Status: DC | PRN
  Administered 2015-08-12: 100 mg via INTRAVENOUS

## 2015-08-12 MED ORDER — ROCURONIUM BROMIDE 50 MG/5ML IV SOLN
INTRAVENOUS | Status: AC
  Filled 2015-08-12: qty 1

## 2015-08-12 MED ORDER — MIDAZOLAM HCL 5 MG/5ML IJ SOLN
INTRAMUSCULAR | Status: DC | PRN
  Administered 2015-08-12: 2 mg via INTRAVENOUS

## 2015-08-12 MED ORDER — GLYCOPYRROLATE 0.2 MG/ML IJ SOLN
INTRAMUSCULAR | Status: DC | PRN
  Administered 2015-08-12: 0.6 mg via INTRAVENOUS

## 2015-08-12 MED ORDER — HYDRALAZINE HCL 20 MG/ML IJ SOLN: 10.0000 mg | INTRAMUSCULAR | Status: DC | PRN

## 2015-08-12 MED ORDER — BUPIVACAINE HCL 0.25 % IJ SOLN
INTRAMUSCULAR | Status: DC | PRN
  Administered 2015-08-12: 8 mL

## 2015-08-12 MED ORDER — DEXAMETHASONE SODIUM PHOSPHATE 10 MG/ML IJ SOLN
INTRAMUSCULAR | Status: AC
  Filled 2015-08-12: qty 2

## 2015-08-12 MED ORDER — LABETALOL HCL 5 MG/ML IV SOLN
INTRAVENOUS | Status: DC | PRN
  Administered 2015-08-12: 2.5 mg via INTRAVENOUS

## 2015-08-12 MED ORDER — POTASSIUM CHLORIDE IN NACL 20-0.9 MEQ/L-% IV SOLN
INTRAVENOUS | Status: DC
Start: 2015-08-12 — End: 2015-08-12
  Filled 2015-08-12 (×2): qty 1000

## 2015-08-12 MED ORDER — ROCURONIUM BROMIDE 100 MG/10ML IV SOLN
INTRAVENOUS | Status: DC | PRN
  Administered 2015-08-12: 40 mg via INTRAVENOUS

## 2015-08-12 MED ORDER — OXYCODONE-ACETAMINOPHEN 5-325 MG PO TABS: 1.0000 | ORAL_TABLET | ORAL | Status: DC | PRN

## 2015-08-12 MED ORDER — OXYCODONE HCL 5 MG PO TABS
5.0000 mg | ORAL_TABLET | ORAL | Status: DC | PRN
  Administered 2015-08-12 – 2015-08-13 (×2): 10 mg via ORAL
  Filled 2015-08-12 (×2): qty 2

## 2015-08-12 MED ORDER — SODIUM CHLORIDE 0.9 % IR SOLN
Status: DC | PRN
  Administered 2015-08-12: 1

## 2015-08-12 MED ORDER — BUPIVACAINE HCL (PF) 0.25 % IJ SOLN
INTRAMUSCULAR | Status: AC
  Filled 2015-08-12: qty 30

## 2015-08-12 MED ORDER — ONDANSETRON HCL 4 MG/2ML IJ SOLN
INTRAMUSCULAR | Status: AC
  Filled 2015-08-12: qty 2

## 2015-08-12 MED ORDER — DEXAMETHASONE SODIUM PHOSPHATE 10 MG/ML IJ SOLN
INTRAMUSCULAR | Status: DC | PRN
  Administered 2015-08-12: 10 mg via INTRAVENOUS

## 2015-08-12 MED ORDER — LACTATED RINGERS IV SOLN
INTRAVENOUS | Status: DC
  Administered 2015-08-12: 09:00:00 via INTRAVENOUS

## 2015-08-12 MED ORDER — LACTATED RINGERS IV SOLN
INTRAVENOUS | Status: DC | PRN
  Administered 2015-08-12 (×2): via INTRAVENOUS

## 2015-08-12 MED ORDER — 0.9 % SODIUM CHLORIDE (POUR BTL) OPTIME
TOPICAL | Status: DC | PRN
  Administered 2015-08-12: 1000 mL

## 2015-08-12 MED ORDER — FENTANYL CITRATE (PF) 250 MCG/5ML IJ SOLN
INTRAMUSCULAR | Status: AC
  Filled 2015-08-12: qty 5

## 2015-08-12 MED ORDER — NEOSTIGMINE METHYLSULFATE 10 MG/10ML IV SOLN
INTRAVENOUS | Status: DC | PRN
Start: 2015-08-12 — End: 2015-08-12
  Administered 2015-08-12: 4 mg via INTRAVENOUS

## 2015-08-12 MED ORDER — MORPHINE SULFATE (PF) 2 MG/ML IV SOLN
1.0000 mg | INTRAVENOUS | Status: DC | PRN
Start: 2015-08-12 — End: 2015-08-13

## 2015-08-12 MED ORDER — SODIUM CHLORIDE 0.9 % IV BOLUS (SEPSIS)
1000.0000 mL | Freq: Once | INTRAVENOUS | Status: AC
  Administered 2015-08-12: 1000 mL via INTRAVENOUS

## 2015-08-12 MED ORDER — ONDANSETRON HCL 4 MG/2ML IJ SOLN
4.0000 mg | Freq: Four times a day (QID) | INTRAMUSCULAR | Status: DC | PRN
  Administered 2015-08-12: 4 mg via INTRAVENOUS
  Filled 2015-08-12: qty 2

## 2015-08-12 MED ORDER — FENTANYL CITRATE (PF) 100 MCG/2ML IJ SOLN
INTRAMUSCULAR | Status: DC | PRN
  Administered 2015-08-12 (×2): 100 ug via INTRAVENOUS
  Administered 2015-08-12: 50 ug via INTRAVENOUS

## 2015-08-12 MED ORDER — PROPOFOL 10 MG/ML IV BOLUS
INTRAVENOUS | Status: AC
  Filled 2015-08-12: qty 20

## 2015-08-12 MED ORDER — METHOCARBAMOL 500 MG PO TABS
500.0000 mg | ORAL_TABLET | Freq: Four times a day (QID) | ORAL | Status: DC | PRN
  Administered 2015-08-12 – 2015-08-13 (×3): 500 mg via ORAL
  Filled 2015-08-12 (×3): qty 1

## 2015-08-12 MED ORDER — MIDAZOLAM HCL 2 MG/2ML IJ SOLN
INTRAMUSCULAR | Status: AC
  Filled 2015-08-12: qty 2

## 2015-08-12 MED ORDER — DEXTROSE 5 % IV SOLN: 20.0000 mg | INTRAVENOUS | Status: DC | PRN

## 2015-08-12 SURGICAL SUPPLY — 52 items
APL SKNCLS STERI-STRIP NONHPOA (GAUZE/BANDAGES/DRESSINGS) ×1
APPLIER CLIP 5 13 M/L LIGAMAX5 (MISCELLANEOUS)
APR CLP MED LRG 5 ANG JAW (MISCELLANEOUS)
BAG SPEC RTRVL 10 TROC 200 (ENDOMECHANICALS) ×1
BAG SPEC RTRVL LRG 6X4 10 (ENDOMECHANICALS)
BENZOIN TINCTURE PRP APPL 2/3 (GAUZE/BANDAGES/DRESSINGS) ×3 IMPLANT
CANISTER SUCTION 2500CC (MISCELLANEOUS) ×3 IMPLANT
CHLORAPREP W/TINT 26ML (MISCELLANEOUS) ×3 IMPLANT
CLIP APPLIE 5 13 M/L LIGAMAX5 (MISCELLANEOUS) IMPLANT
CLIP LIGATING HEMO O LOK GREEN (MISCELLANEOUS) ×3 IMPLANT
CLOSURE WOUND 1/2 X4 (GAUZE/BANDAGES/DRESSINGS) ×1
COVER MAYO STAND STRL (DRAPES) ×3 IMPLANT
COVER SURGICAL LIGHT HANDLE (MISCELLANEOUS) ×3 IMPLANT
COVER TRANSDUCER ULTRASND (DRAPES) IMPLANT
DEVICE TROCAR PUNCTURE CLOSURE (ENDOMECHANICALS) ×3 IMPLANT
DRAPE C-ARM 42X72 X-RAY (DRAPES) ×3 IMPLANT
ELECT REM PT RETURN 9FT ADLT (ELECTROSURGICAL) ×3
ELECTRODE REM PT RTRN 9FT ADLT (ELECTROSURGICAL) ×1 IMPLANT
GAUZE SPONGE 2X2 8PLY STRL LF (GAUZE/BANDAGES/DRESSINGS) ×1 IMPLANT
GLOVE BIO SURGEON STRL SZ7.5 (GLOVE) ×5 IMPLANT
GLOVE BIOGEL PI IND STRL 7.0 (GLOVE) IMPLANT
GLOVE BIOGEL PI INDICATOR 7.0 (GLOVE) ×4
GLOVE SURG SS PI 7.0 STRL IVOR (GLOVE) ×4 IMPLANT
GOWN STRL REUS W/ TWL LRG LVL3 (GOWN DISPOSABLE) ×2 IMPLANT
GOWN STRL REUS W/ TWL XL LVL3 (GOWN DISPOSABLE) ×1 IMPLANT
GOWN STRL REUS W/TWL LRG LVL3 (GOWN DISPOSABLE) ×6
GOWN STRL REUS W/TWL XL LVL3 (GOWN DISPOSABLE) ×3
IV CATH 14GX2 1/4 (CATHETERS) ×3 IMPLANT
KIT BASIN OR (CUSTOM PROCEDURE TRAY) ×3 IMPLANT
KIT ROOM TURNOVER OR (KITS) ×3 IMPLANT
NDL INSUFFLATION 14GA 120MM (NEEDLE) ×1 IMPLANT
NEEDLE INSUFFLATION 14GA 120MM (NEEDLE) ×3 IMPLANT
NS IRRIG 1000ML POUR BTL (IV SOLUTION) ×3 IMPLANT
PAD ARMBOARD 7.5X6 YLW CONV (MISCELLANEOUS) ×6 IMPLANT
POUCH RETRIEVAL ECOSAC 10 (ENDOMECHANICALS) IMPLANT
POUCH RETRIEVAL ECOSAC 10MM (ENDOMECHANICALS) ×2
POUCH SPECIMEN RETRIEVAL 10MM (ENDOMECHANICALS) IMPLANT
SCISSORS LAP 5X35 DISP (ENDOMECHANICALS) ×3 IMPLANT
SET CHOLANGIOGRAPHY FRANKLIN (SET/KITS/TRAYS/PACK) ×3 IMPLANT
SET IRRIG TUBING LAPAROSCOPIC (IRRIGATION / IRRIGATOR) ×3 IMPLANT
SLEEVE ENDOPATH XCEL 5M (ENDOMECHANICALS) ×3 IMPLANT
SPECIMEN JAR SMALL (MISCELLANEOUS) ×3 IMPLANT
SPONGE GAUZE 2X2 STER 10/PKG (GAUZE/BANDAGES/DRESSINGS) ×2
STRIP CLOSURE SKIN 1/2X4 (GAUZE/BANDAGES/DRESSINGS) ×2 IMPLANT
SUT MNCRL AB 3-0 PS2 18 (SUTURE) ×5 IMPLANT
SUT VICRYL 0 UR6 27IN ABS (SUTURE) ×2 IMPLANT
TOWEL OR 17X24 6PK STRL BLUE (TOWEL DISPOSABLE) ×3 IMPLANT
TOWEL OR 17X26 10 PK STRL BLUE (TOWEL DISPOSABLE) ×3 IMPLANT
TRAY LAPAROSCOPIC MC (CUSTOM PROCEDURE TRAY) ×3 IMPLANT
TROCAR XCEL NON-BLD 11X100MML (ENDOMECHANICALS) ×3 IMPLANT
TROCAR XCEL NON-BLD 5MMX100MML (ENDOMECHANICALS) ×3 IMPLANT
TUBING INSUFFLATION (TUBING) ×3 IMPLANT

## 2015-08-12 NOTE — Discharge Instructions (Signed)
Your appointment is at 10:45am, please arrive at least 18mn before your appointment to complete your check in paperwork.  If you are unable to arrive 35m prior to your appointment time we may have to cancel or reschedule you.  LAPAROSCOPIC SURGERY: POST OP INSTRUCTIONS  1. DIET: Follow a light bland diet the first 24 hours after arrival home, such as soup, liquids, crackers, etc. Be sure to include lots of fluids daily. Avoid fast food or heavy meals as your are more likely to get nauseated. Eat a low fat the next few days after surgery.  2. Take your usually prescribed home medications unless otherwise directed. 3. PAIN CONTROL:  1. Pain is best controlled by a usual combination of three different methods TOGETHER:  1. Ice/Heat 2. Over the counter pain medication 3. Prescription pain medication 2. Most patients will experience some swelling and bruising around the incisions. Ice packs or heating pads (30-60 minutes up to 6 times a day) will help. Use ice for the first few days to help decrease swelling and bruising, then switch to heat to help relax tight/sore spots and speed recovery. Some people prefer to use ice alone, heat alone, alternating between ice & heat. Experiment to what works for you. Swelling and bruising can take several weeks to resolve.  3. It is helpful to take an over-the-counter pain medication regularly for the first few weeks. Choose one of the following that works best for you:  1. Naproxen (Aleve, etc) Two '220mg'$  tabs twice a day 2. Ibuprofen (Advil, etc) Three '200mg'$  tabs four times a day (every meal & bedtime) 3. Acetaminophen (Tylenol, etc) 500-'650mg'$  four times a day (every meal & bedtime) 4. A prescription for pain medication (such as oxycodone, hydrocodone, etc) should be given to you upon discharge. Take your pain medication as prescribed.  1. If you are having problems/concerns with the prescription medicine (does not control pain, nausea, vomiting, rash, itching,  etc), please call [phone] see if we need to switch you to a different pain medicine that will work better for you and/or control your side effect better. 2. If you need a refill on your pain medication, please contact your pharmacy. They will contact our office to request authorization. Prescriptions will not be filled after 5 pm or on week-ends. 4. Avoid getting constipated. Between the surgery and the pain medications, it is common to experience some constipation. Increasing fluid intake and taking a fiber supplement (such as Metamucil, Citrucel, FiberCon, MiraLax, etc) 1-2 times a day regularly will usually help prevent this problem from occurring. A mild laxative (prune juice, Milk of Magnesia, MiraLax, etc) should be taken according to package directions if there are no bowel movements after 48 hours.  5. Watch out for diarrhea. If you have many loose bowel movements, simplify your diet to bland foods & liquids for a few days. Stop any stool softeners and decrease your fiber supplement. Switching to mild anti-diarrheal medications (Kayopectate, Pepto Bismol) can help. If this worsens or does not improve, please call [email or web address]. Wash / shower every day. You may shower over the dressings as they are waterproof. Continue to shower over incision(s) after the dressing is off. 7. Remove your waterproof bandages 5 days after surgery. You may leave the incision open to air. You may replace a dressing/Band-Aid to cover the incision for comfort if you wish.  8. ACTIVITIES as tolerated:  1. You may resume regular (light) daily activities beginning the next day--such as daily self-care,  walking, climbing stairs--gradually increasing activities as tolerated. If you can walk 30 minutes without difficulty, it is safe to try more intense activity such as jogging, treadmill, bicycling, low-impact aerobics, swimming, etc. 2. Save the most intensive and strenuous activity for last such as sit-ups, heavy lifting,  contact sports, etc Refrain from any heavy lifting or straining until you are off narcotics for pain control.  3. DO NOT PUSH THROUGH PAIN. Let pain be your guide: If it hurts to do something, don't do it. Pain is your body warning you to avoid that activity for another week until the pain goes down. 4. You may drive when you are no longer taking prescription pain medication, you can comfortably wear a seatbelt, and you can safely maneuver your car and apply brakes. 5. You may have sexual intercourse when it is comfortable.  9. FOLLOW UP in our office  1. Please call [facility] at [phone] to set up an appointment to see your surgeon in the office for a follow-up appointment approximately 2-3 weeks after your surgery. 2. Make sure that you call for this appointment the day you arrive home to insure a convenient appointment time.      10. IF YOU HAVE DISABILITY OR FAMILY LEAVE FORMS, BRING THEM TO THE               OFFICE FOR PROCESSING.   WHEN TO CALL us [phone]:  1. Poor pain control 2. Reactions / problems with new medications (rash/itching, nausea, etc)  3. Fever over 101.5 F (38.5 C) 4. Inability to urinate 5. Nausea and/or vomiting 6. Worsening swelling or bruising 7. Continued bleeding from incision. 8. Increased pain, redness, or drainage from the incision  The clinic staff is available to answer your questions during regular business hours (8:30am-5pm). Please dont hesitate to call and ask to speak to one of our nurses for clinical concerns.  If you have a medical emergency, go to the nearest emergency room or call 911.  A surgeon from [facility] is always on call at the [facility], [place], [place], [place], [place] [address] ?  MAIN: [phone] ? TOLL FREE: [phone] ?  FAX [phone]  [email or web address]

## 2015-08-12 NOTE — ED Notes (Signed)
EDP at bedside  

## 2015-08-12 NOTE — Op Note (Signed)
08/12/2015  11:18 AM  PATIENT:  Adam [name]  60 y.o. male  PRE-OPERATIVE DIAGNOSIS:  Acute cholecystitis  POST-OPERATIVE DIAGNOSIS:  Acute cholecystitis  PROCEDURE:  Procedure(s): LAPAROSCOPIC CHOLECYSTECTOMY (N/A)  SURGEON:  Surgeon(s) and Role:    * Ralene Ok, MD - Primary  ASSISTANTS: Dyann Kief, MD   ANESTHESIA:   local and general  EBL: 5cc Total I/O In: 1000 [I.V.:1000] Out: -   BLOOD ADMINISTERED:none  DRAINS: none   LOCAL MEDICATIONS USED:  BUPIVICAINE   SPECIMEN:  Source of Specimen:  gallbladder  DISPOSITION OF SPECIMEN:  PATHOLOGY  COUNTS:  YES  TOURNIQUET:  * No tourniquets in log *  DICTATION: .Dragon Dictation The patient was taken to the operating and placed in the supine position with bilateral SCDs in place. The patient was prepped and draped in the usual sterile fashion. A time out was called and all facts were verified. A pneumoperitoneum was obtained via A Veress needle technique to a pressure of 30mm of mercury.  A 66mm trochar was then placed in the right upper quadrant under visualization, and there were no injuries to any abdominal organs. A 11 mm port was then placed in the umbilical region after infiltrating with local anesthesia under direct visualization. A second and third epigastric port and right lower quadrant port placement under direct visualization, respectively. The gallbladder was identified and retracted, the peritoneum was then sharply dissected from the gallbladder and this dissection was carried down to Calot's triangle. The gallbladder was identified and stripped away circumferentially and seen going into the gallbladder 360, the critical angle was obtained.  2 clips were placed proximally one distally and the cystic duct transected. The cystic artery was identified and 2 clips placed proximally and one distally and transected. We then proceeded to remove the gallbladder off the hepatic fossa with Bovie cautery. A  retrieval bag was then placed in the abdomen and gallbladder placed in the bag. The hepatic fossa was then reexamined and hemostasis was achieved with Bovie cautery and was excellent at the end of the case. The subhepatic fossa and perihepatic fossa was then irrigated until the effluent was clear.  The gallbladder and bag were removed from the abdominal cavity. The 11 mm trocar fascia was reapproximated with the Endo Close #1 Vicryl x3. The pneumoperitoneum was evacuated and all trochars removed under direct visulalization. The skin was then closed with 4-0 Monocryl and the skin dressed with Steri-Strips, gauze, and tape. The patient was awaken from general anesthesia and taken to the recovery room in stable condition.   PLAN OF CARE: Admit for overnight observation  PATIENT DISPOSITION:  PACU - hemodynamically stable.   Delay start of Pharmacological VTE agent (>24hrs) due to surgical blood loss or risk of bleeding: not applicable

## 2015-08-12 NOTE — Anesthesia Preprocedure Evaluation (Signed)
Anesthesia Evaluation  Patient identified by MRN, date of birth, ID band Patient awake    Reviewed: Allergy & Precautions, NPO status , Patient's Chart, lab work & pertinent test results  Airway Mallampati: II  TM Distance: >3 FB Neck ROM: Full    Dental  (+) Dental Advisory Given, Edentulous Lower, Edentulous Upper   Pulmonary neg pulmonary ROS,    Pulmonary exam normal breath sounds clear to auscultation       Cardiovascular hypertension, (-) angina(-) Past MI Normal cardiovascular exam Rhythm:Regular Rate:Normal     Neuro/Psych negative neurological ROS  negative psych ROS   GI/Hepatic (+)     substance abuse  marijuana use, Hepatitis -, CAcute cholecystitis   Endo/Other  negative endocrine ROS  Renal/GU negative Renal ROS     Musculoskeletal  (+) Arthritis , Osteoarthritis,    Abdominal   Peds  Hematology negative hematology ROS (+)   Anesthesia Other Findings Day of surgery medications reviewed with the patient.  Reproductive/Obstetrics                             Anesthesia Physical Anesthesia Plan  ASA: II  Anesthesia Plan: General   Post-op Pain Management:    Induction: Intravenous  Airway Management Planned: Oral ETT  Additional Equipment:   Intra-op Plan:   Post-operative Plan: Extubation in OR  Informed Consent: I have reviewed the patients History and Physical, chart, labs and discussed the procedure including the risks, benefits and alternatives for the proposed anesthesia with the patient or authorized representative who has indicated his/her understanding and acceptance.   Dental advisory given  Plan Discussed with: CRNA  Anesthesia Plan Comments: (Risks/benefits of general anesthesia discussed with patient including risk of damage to teeth, lips, gum, and tongue, nausea/vomiting, allergic reactions to medications, and the possibility of heart attack,  stroke and death.  All patient questions answered.  Patient wishes to proceed.)        Anesthesia Quick Evaluation

## 2015-08-12 NOTE — H&P (Signed)
[facility] Surgery Admission Note  Adam [name] January 30, 1956  [id number].    Requesting MD: Dr. Dina Rich Chief Complaint/Reason for Consult: acute calculous cholecystitis  HPI:  60 y/o white male with PMH HCV, anxiety, HTN, and HLD presents to [facility] with a few days of sharp RUQ/epigastric abdominal pain with N/V, sweats, anorexia, and fatigue.  Nothing specific brought it on.  The pain was 7/10, much improved after morphine.  No c/o CP/SOB, fever/chills, changes in bowel/bladder habits.  No radicular pain, no other precipitating/alleviating factors.  Never had this before.  Finished 3 months of HCV treatment.  H/o knee surgery.  ROS: All systems reviewed and otherwise negative except for as above  No family history on file.  Past Medical History  Diagnosis Date  . Chronic hepatitis C without hepatic coma ([place]) 08/16/2014  . HTN (hypertension) 08/16/2014  . H/O knee surgery 08/16/2014  . Hyperlipidemia 08/16/2014  . Arthritis     History reviewed. No pertinent past surgical history.  Social History:  reports that he has never smoked. He has never used smokeless tobacco. He reports that he uses illicit drugs (Marijuana). He reports that he does not drink alcohol.  Allergies: No Known Allergies   (Not in a hospital admission)  Blood pressure 181/95, pulse 65, temperature 98.1 F (36.7 C), temperature source Oral, resp. rate 9, SpO2 96 %. Physical Exam: General: pleasant, WD/WN white male who is laying in bed in NAD HEENT: head is normocephalic, atraumatic.  Sclera are noninjected.  PERRL.  Ears and nose without any masses or lesions.  Mouth is pink and moist, well healed scalp laceration scar. Heart: regular, rate, and rhythm.  No obvious murmurs, gallops, or rubs noted.  Palpable pedal pulses bilaterally Lungs: CTAB, no wheezes, rhonchi, or rales noted.  Respiratory effort nonlabored Abd: soft, mild tenderness in the RUQ and epigastrium, +BS, no masses, hernias, or  organomegaly MS: all 4 extremities are symmetrical with no cyanosis, clubbing, or edema. Skin: warm and dry with no masses, lesions, or rashes Psych: A&Ox3 with an appropriate affect.   Results for orders placed or performed during the hospital encounter of 08/11/15 (from the past 48 hour(s))  Lipase, blood     Status: None   Collection Time: 08/11/15  9:17 PM  Result Value Ref Range   Lipase 50 11 - 51 U/L  Comprehensive metabolic panel     Status: Abnormal   Collection Time: 08/11/15  9:17 PM  Result Value Ref Range   Sodium 139 135 - 145 mmol/L   Potassium 3.4 (L) 3.5 - 5.1 mmol/L   Chloride 101 101 - 111 mmol/L   CO2 22 22 - 32 mmol/L   Glucose, Bld 156 (H) 65 - 99 mg/dL   BUN 9 6 - 20 mg/dL   Creatinine, Ser 0.95 0.61 - 1.24 mg/dL   Calcium 9.7 8.9 - 10.3 mg/dL   Total Protein 8.9 (H) 6.5 - 8.1 g/dL   Albumin 4.2 3.5 - 5.0 g/dL   AST 23 15 - 41 U/L   ALT 17 17 - 63 U/L   Alkaline Phosphatase 78 38 - 126 U/L   Total Bilirubin 1.0 0.3 - 1.2 mg/dL   GFR calc non Af Amer >60 >60 mL/min   GFR calc Af Amer >60 >60 mL/min    Comment: (NOTE) The eGFR has been calculated using the CKD EPI equation. This calculation has not been validated in all clinical situations. eGFR's persistently <60 mL/min signify possible Chronic Kidney Disease.  Anion gap 16 (H) 5 - 15  CBC     Status: Abnormal   Collection Time: 08/11/15  9:17 PM  Result Value Ref Range   WBC 15.1 (H) 4.0 - 10.5 K/uL   RBC 5.74 4.22 - 5.81 MIL/uL   Hemoglobin 14.5 13.0 - 17.0 g/dL   HCT 43.8 39.0 - 52.0 %   MCV 76.3 (L) 78.0 - 100.0 fL   MCH 25.3 (L) 26.0 - 34.0 pg   MCHC 33.1 30.0 - 36.0 g/dL   RDW 13.9 11.5 - 15.5 %   Platelets 217 150 - 400 K/uL  Urinalysis, Routine w reflex microscopic     Status: Abnormal   Collection Time: 08/11/15  9:25 PM  Result Value Ref Range   Color, Urine YELLOW YELLOW   APPearance CLEAR CLEAR   Specific Gravity, Urine 1.020 1.005 - 1.030   pH 6.5 5.0 - 8.0   Glucose, UA  NEGATIVE NEGATIVE mg/dL   Hgb urine dipstick SMALL (A) NEGATIVE   Bilirubin Urine NEGATIVE NEGATIVE   Ketones, ur NEGATIVE NEGATIVE mg/dL   Protein, ur NEGATIVE NEGATIVE mg/dL   Nitrite NEGATIVE NEGATIVE   Leukocytes, UA NEGATIVE NEGATIVE  Urine microscopic-add on     Status: Abnormal   Collection Time: 08/11/15  9:25 PM  Result Value Ref Range   Squamous Epithelial / LPF 0-5 (A) NONE SEEN   WBC, UA 0-5 0 - 5 WBC/hpf   RBC / HPF 6-30 0 - 5 RBC/hpf   Bacteria, UA RARE (A) NONE SEEN   Urine-Other MUCOUS PRESENT    Ct Abdomen Pelvis W Contrast  08/12/2015  CLINICAL DATA:  Abdominal pain, onset at 03:00. EXAM: CT ABDOMEN AND PELVIS WITH CONTRAST TECHNIQUE: Multidetector CT imaging of the abdomen and pelvis was performed using the standard protocol following bolus administration of intravenous contrast. CONTRAST:  171m ISOVUE-300 IOPAMIDOL (ISOVUE-300) INJECTION 61% COMPARISON:  None. FINDINGS: There is moderate mural thickening of the gallbladder and there appears to be cholelithiasis. No bile duct dilatation. No focal liver lesions. There are normal appearances of the pancreas, spleen and adrenals. There is a 2 cm complex mass of the right kidney lower pole. This cannot be characterized. There are moderately thick internal septations. Left kidney is normal. Collecting systems and ureters are normal. Urinary bladder is normal. There are normal appearances of the stomach, small bowel and colon. The appendix is normal. There is a 5 x 7 mm noncalcified nodule in the left lower lobe post Ro lateral periphery. No other significant abnormalities evident in the lower chest. No significant skeletal lesion is evident. Moderately severe degenerative lumbar disc disease is present at L4-5 and L5-S1. IMPRESSION: 1. Gallbladder mural thickening and cholelithiasis. This could represent acute cholecystitis. Consider further evaluation with ultrasound. 2. Complex 2 cm mass of the lower pole right kidney. Recommend MRI  without/with contrast for better characterization. 3. Noncalcified left lower lobe pulmonary nodule with mean axial dimension 6 mm. Non-contrast chest CT at 6-12 months is recommended. If the nodule is stable at time of repeat CT, then future CT at 18-24 months (from today's scan) is considered optional for low-risk patients, but is recommended for high-risk patients. This recommendation follows the consensus statement: Guidelines for Management of Incidental Pulmonary Nodules Detected on CT Images:From the Fleischner Society 2017; published online before print ([id number]/[id number]. 4. These results will be called to the ordering clinician or representative by the Radiologist Assistant, and communication documented in the PACS or zVision Dashboard. Electronically Signed   By: DQuillian Quince  Armandina Stammer M.D.   On: 08/12/2015 04:33   US Abdomen Limited  08/12/2015  CLINICAL DATA:  Abdominal pain for 2 days. History of hepatitis-C. Assess for cholecystitis. EXAM: US ABDOMEN LIMITED - RIGHT UPPER QUADRANT COMPARISON:  CT abdomen and pelvis May 12th 2017 at 0409 hours FINDINGS: Gallbladder: Echogenic 2.1 cm gallstone at the gallbladder neck with acoustic shadowing. Echogenic layering gallbladder sludge. Gallbladder wall thickening at 6 mm. Mild pericholecystic fluid. No sonographic Murphy's sign elicited. Common bile duct: Diameter: 6 mm Liver: No focal lesion identified. Within normal limits in parenchymal echogenicity. Hepatopetal portal vein. Included view of the RIGHT kidney demonstrates lower pole solid mildly echogenic 2.5 cm mass is noted on today's CT. IMPRESSION: Sonographic findings of cholelithiasis and acute cholecystitis though, no sonographic Murphy's sign elicited. 2.5 cm solid RIGHT renal mass. Consider MRI with and without contrast for further characterization. Electronically Signed   By: Elon Alas M.D.   On: 08/12/2015 06:13      Assessment/Plan Acute calculous cholecystitis Leukocytosis  - 15.1 Hypokalemia - supplement in IVF H/o treated HCV Anxiety HTN/HLD  Plan: 1.  Admit to [facility], lap chole today possible IOC 2.  NPO, IVF, pain control, antiemetics, antibiotics (rocephin) 3.  We discussed risks/benefits including infection, bleeding, bruising, damage to other intra-abdominal structures, bile leak, drain placement, repeat surgery, conversion to open.     Nat Christen, [facility] Surgery 08/12/2015, 7:21 AM Pager: [phone] (7am - 4:30pm M-F; 7am - 11:30am Sa/Su)

## 2015-08-12 NOTE — ED Notes (Signed)
Informed consent transcribed and at the bedside.

## 2015-08-12 NOTE — Anesthesia Postprocedure Evaluation (Signed)
Anesthesia Post Note  Patient: Adam [name]  Procedure(s) Performed: Procedure(s) (LRB): LAPAROSCOPIC CHOLECYSTECTOMY WITH INTRAOPERATIVE CHOLANGIOGRAM (N/A)  Patient location during evaluation: PACU Anesthesia Type: General Level of consciousness: awake and alert Pain management: pain level controlled Vital Signs Assessment: post-procedure vital signs reviewed and stable Respiratory status: spontaneous breathing, nonlabored ventilation, respiratory function stable and patient connected to nasal cannula oxygen Cardiovascular status: blood pressure returned to baseline and stable Postop Assessment: no signs of nausea or vomiting Anesthetic complications: no    Last Vitals:  Filed Vitals:   08/12/15 1306 08/12/15 1312  BP: 162/78 151/91  Pulse: 84   Temp: 37.1 C   Resp: 18     Last Pain:  Filed Vitals:   08/12/15 1707  PainSc: Asleep                 Catalina Gravel

## 2015-08-12 NOTE — Transfer of Care (Signed)
Immediate Anesthesia Transfer of Care Note  Patient: Adam [name]  Procedure(s) Performed: Procedure(s): LAPAROSCOPIC CHOLECYSTECTOMY WITH INTRAOPERATIVE CHOLANGIOGRAM (N/A)  Patient Location: PACU  Anesthesia Type:General  Level of Consciousness: awake, alert , oriented and patient cooperative  Airway & Oxygen Therapy: Patient Spontanous Breathing and Patient connected to nasal cannula oxygen  Post-op Assessment: Report given to RN and Post -op Vital signs reviewed and stable  Post vital signs: Reviewed and stable  Last Vitals:  Filed Vitals:   08/12/15 0630 08/12/15 1128  BP: 181/95 149/91  Pulse: 65 98  Temp:  36.9 C  Resp: 9 16    Last Pain:  Filed Vitals:   08/12/15 1128  PainSc: 6          Complications: No apparent anesthesia complications

## 2015-08-12 NOTE — ED Provider Notes (Signed)
CSN: [id number]     Arrival date & time 08/11/15  2048 History   First MD Initiated Contact with Patient 08/12/15 0001     Chief Complaint  Patient presents with  . Abdominal Pain     (Consider location/radiation/quality/duration/timing/severity/associated sxs/prior Treatment) HPI Comments: Patient presents with complaint of sharp, severe epigastric pain that started yesterday morning and has persisted. He reports the pain causes nausea and vomiting to the point where he is unable to eat or drink without emesis. No fever, hematemesis or change in bowel movements. The pain stays in the epigastrium and does not radiate. No history of ulcers. No regular use of NSAIDs. He denies chest pain and SOB.  Patient is a 60 y.o. male presenting with abdominal pain. The history is provided by the patient. No language interpreter was used.  Abdominal Pain Pain location:  Epigastric Pain quality: sharp   Pain radiates to:  Does not radiate Associated symptoms: nausea and vomiting   Associated symptoms: no chest pain, no chills, no diarrhea, no fever and no shortness of breath     Past Medical History  Diagnosis Date  . Chronic hepatitis C without hepatic coma ([place]) 08/16/2014  . HTN (hypertension) 08/16/2014  . H/O knee surgery 08/16/2014  . Hyperlipidemia 08/16/2014  . Arthritis    History reviewed. No pertinent past surgical history. No family history on file. Social History  Substance Use Topics  . Smoking status: Never Smoker   . Smokeless tobacco: Never Used  . Alcohol Use: No    Review of Systems  Constitutional: Negative for fever and chills.  Respiratory: Negative.  Negative for shortness of breath.   Cardiovascular: Negative.  Negative for chest pain.  Gastrointestinal: Positive for nausea, vomiting and abdominal pain. Negative for diarrhea.  Musculoskeletal: Negative.  Negative for myalgias and back pain.  Neurological: Negative.       Allergies  Review of patient's allergies  indicates no known allergies.  Home Medications   Prior to Admission medications   Medication Sig Start Date End Date Taking? Authorizing Provider  atorvastatin (LIPITOR) 10 MG tablet Take 10 mg by mouth daily.    Historical Provider, MD  ibuprofen (ADVIL,MOTRIN) 200 MG tablet Take 400 mg by mouth every 6 (six) hours as needed.    Historical Provider, MD  Ledipasvir-Sofosbuvir (HARVONI) 90-400 MG TABS Take 1 tablet by mouth daily. 08/16/14   Truman Hayward, MD  ribavirin (REBETOL) 200 MG capsule Take 3 capsules (600 mg total) by mouth daily. 08/16/14   Truman Hayward, MD   BP 164/98 mmHg  Pulse 70  Temp(Src) 98.1 F (36.7 C) (Oral)  Resp 18  SpO2 99% Physical Exam  Constitutional: He is oriented to person, place, and time. He appears well-developed and well-nourished.  HENT:  Head: Normocephalic.  Neck: Normal range of motion. Neck supple.  Cardiovascular: Normal rate and regular rhythm.   Pulmonary/Chest: Effort normal and breath sounds normal.  Abdominal: Soft. Bowel sounds are normal. There is tenderness (Epigastric and periumbilical tenderness. ). There is no rebound and no guarding.  Musculoskeletal: Normal range of motion.  Neurological: He is alert and oriented to person, place, and time.  Skin: Skin is warm and dry. No rash noted.  Psychiatric: He has a normal mood and affect.    ED Course  Procedures (including critical care time) Labs Review Labs Reviewed  COMPREHENSIVE METABOLIC PANEL - Abnormal; Notable for the following:    Potassium 3.4 (*)    Glucose, Bld 156 (*)  Total Protein 8.9 (*)    Anion gap 16 (*)    All other components within normal limits  CBC - Abnormal; Notable for the following:    WBC 15.1 (*)    MCV 76.3 (*)    MCH 25.3 (*)    All other components within normal limits  URINALYSIS, ROUTINE W REFLEX MICROSCOPIC (NOT AT [facility]) - Abnormal; Notable for the following:    Hgb urine dipstick SMALL (*)    All other components within  normal limits  URINE MICROSCOPIC-ADD ON - Abnormal; Notable for the following:    Squamous Epithelial / LPF 0-5 (*)    Bacteria, UA RARE (*)    All other components within normal limits  LIPASE, BLOOD   Ct Abdomen Pelvis W Contrast  08/12/2015  CLINICAL DATA:  Abdominal pain, onset at 03:00. EXAM: CT ABDOMEN AND PELVIS WITH CONTRAST TECHNIQUE: Multidetector CT imaging of the abdomen and pelvis was performed using the standard protocol following bolus administration of intravenous contrast. CONTRAST:  15mL ISOVUE-300 IOPAMIDOL (ISOVUE-300) INJECTION 61% COMPARISON:  None. FINDINGS: There is moderate mural thickening of the gallbladder and there appears to be cholelithiasis. No bile duct dilatation. No focal liver lesions. There are normal appearances of the pancreas, spleen and adrenals. There is a 2 cm complex mass of the right kidney lower pole. This cannot be characterized. There are moderately thick internal septations. Left kidney is normal. Collecting systems and ureters are normal. Urinary bladder is normal. There are normal appearances of the stomach, small bowel and colon. The appendix is normal. There is a 5 x 7 mm noncalcified nodule in the left lower lobe post Ro lateral periphery. No other significant abnormalities evident in the lower chest. No significant skeletal lesion is evident. Moderately severe degenerative lumbar disc disease is present at L4-5 and L5-S1. IMPRESSION: 1. Gallbladder mural thickening and cholelithiasis. This could represent acute cholecystitis. Consider further evaluation with ultrasound. 2. Complex 2 cm mass of the lower pole right kidney. Recommend MRI without/with contrast for better characterization. 3. Noncalcified left lower lobe pulmonary nodule with mean axial dimension 6 mm. Non-contrast chest CT at 6-12 months is recommended. If the nodule is stable at time of repeat CT, then future CT at 18-24 months (from today's scan) is considered optional for low-risk  patients, but is recommended for high-risk patients. This recommendation follows the consensus statement: Guidelines for Management of Incidental Pulmonary Nodules Detected on CT Images:From the Fleischner Society 2017; published online before print (10.1148/radiol.IJ:2314499). 4. These results will be called to the ordering clinician or representative by the Radiologist Assistant, and communication documented in the PACS or zVision Dashboard. Electronically Signed   By: Andreas Newport M.D.   On: 08/12/2015 04:33   US Abdomen Limited  08/12/2015  CLINICAL DATA:  Abdominal pain for 2 days. History of hepatitis-C. Assess for cholecystitis. EXAM: US ABDOMEN LIMITED - RIGHT UPPER QUADRANT COMPARISON:  CT abdomen and pelvis May 12th 2017 at 0409 hours FINDINGS: Gallbladder: Echogenic 2.1 cm gallstone at the gallbladder neck with acoustic shadowing. Echogenic layering gallbladder sludge. Gallbladder wall thickening at 6 mm. Mild pericholecystic fluid. No sonographic Murphy's sign elicited. Common bile duct: Diameter: 6 mm Liver: No focal lesion identified. Within normal limits in parenchymal echogenicity. Hepatopetal portal vein. Included view of the RIGHT kidney demonstrates lower pole solid mildly echogenic 2.5 cm mass is noted on today's CT. IMPRESSION: Sonographic findings of cholelithiasis and acute cholecystitis though, no sonographic Murphy's sign elicited. 2.5 cm solid RIGHT renal mass. Consider  MRI with and without contrast for further characterization. Electronically Signed   By: Elon Alas M.D.   On: 08/12/2015 06:13    Imaging Review No results found. I have personally reviewed and evaluated these images and lab results as part of my medical decision-making.   EKG Interpretation None      MDM   Final diagnoses:  None    1. Acute cholecystitis  2:00 - pain is controlled with medications. He appears much more comfortable. Waiting for CT to be done and resulted.  5:00 - CT  suspicious for cholecystitis - [place] ordered. Patient updated on results. Pain continues to be well managed.  6:20 - Patient with leukocytosis, US findings c/w cholecystitis and continued pain on re-examination. General surgery paged for admission.  Charlann Lange, PA-C 08/12/15 0626  Charlann Lange, PA-C 08/12/15 0630  Merryl Hacker, MD 08/12/15 2255

## 2015-08-13 DIAGNOSIS — K8010 Calculus of gallbladder with chronic cholecystitis without obstruction: Secondary | ICD-10-CM | POA: Diagnosis not present

## 2015-08-13 DIAGNOSIS — B182 Chronic viral hepatitis C: Secondary | ICD-10-CM | POA: Diagnosis not present

## 2015-08-13 DIAGNOSIS — Z79899 Other long term (current) drug therapy: Secondary | ICD-10-CM | POA: Diagnosis not present

## 2015-08-13 DIAGNOSIS — E876 Hypokalemia: Secondary | ICD-10-CM | POA: Diagnosis not present

## 2015-08-13 DIAGNOSIS — M1990 Unspecified osteoarthritis, unspecified site: Secondary | ICD-10-CM | POA: Diagnosis not present

## 2015-08-13 DIAGNOSIS — E785 Hyperlipidemia, unspecified: Secondary | ICD-10-CM | POA: Diagnosis not present

## 2015-08-13 DIAGNOSIS — I10 Essential (primary) hypertension: Secondary | ICD-10-CM | POA: Diagnosis not present

## 2015-08-13 DIAGNOSIS — D72829 Elevated white blood cell count, unspecified: Secondary | ICD-10-CM | POA: Diagnosis not present

## 2015-08-13 DIAGNOSIS — Z791 Long term (current) use of non-steroidal anti-inflammatories (NSAID): Secondary | ICD-10-CM | POA: Diagnosis not present

## 2015-08-13 MED ORDER — HYDROCODONE-ACETAMINOPHEN 5-325 MG PO TABS: 1.0000 | ORAL_TABLET | Freq: Four times a day (QID) | ORAL | Status: DC | PRN

## 2015-08-13 NOTE — Discharge Summary (Signed)
Patient ID: Adam [name] [id number] 60 y.o. 02-Feb-1956  Admit date: 08/11/2015  Discharge date and time: 08/13/2015  Admitting Physician: Ralene Ok  Discharge Physician: Adin Hector  Admission Diagnoses: Cholecystitis, acute [K81.0]  Discharge Diagnoses: Acute cholecystitis                                         2.5 cm solid right renal mass                                         6 mm left lower lobe pulmonary nodule.  Some 6-12 month follow-up CT recommended.  Operations: Procedure(s): LAPAROSCOPIC CHOLECYSTECTOMY WITH INTRAOPERATIVE CHOLANGIOGRAM  Admission Condition: fair  Discharged Condition: good  Indication for Admission: 60 y/o white male with PMH HCV, anxiety, HTN, and HLD presents to [facility] with a few days of sharp RUQ/epigastric abdominal pain with N/V, sweats, anorexia, and fatigue. Nothing specific brought it on. The pain was 7/10, much improved after morphine. No c/o CP/SOB, fever/chills, changes in bowel/bladder habits. No radicular pain, no other precipitating/alleviating factors. Never had this before. Finished 3 months of HCV treatment. H/o knee surgery.    CT scan showed acute cholecystitis, complex 2 cm mass lower pole right kidney follow-up recommended, noncalcified left lower lobe pulmonary nodule 6 mm, 6-12 month follow-up recommended.  Ultrasound showed gallstones and acute cholecystitis and a 2.5 cm solid right renal mass.   Lab work showed hemoglobin 14.5, WBC 15,000.  Normal liver function tests.  Normal lipase.  Hospital Course: The patient was admitted and started on IV fluids, analgesics, antinausea medicine and antibiotics.  On 08/12/2015 he underwent laparoscopic cholecystectomy by Dr. Rosendo Gros.  Findings were consistent with acute cholecystitis.  He did well and was observed overnight and was ready to go home the following morning.  On the morning of discharge she was ambulatory, voiding, minimal pain.  He was tolerating regular diet  without nausea and wanted to go home.  Examination of the day of discharge revealed that his abdomen was soft and nontender.  All the wounds looked good.  He was alert and comfortable.     Diet and activities were discussed.     He was given a prescription for Norco for pain    He was advised CT scan findings of the right kidney mass and the small 6 mm incidental nodule in his lungs.    He is followed at [facility] [place].  He is instructed to contact Ricka Burdock, PA on Monday and set up an appointment.  I told him to assume that there would be a referral to a urologist about this.  I told him to get follow-up CT scan of his chest it in 12 months.      Consults: None  Significant Diagnostic Studies: Lab work.  CT scan.  Surgical pathology.  Ultrasound.  Treatments: surgery: Laparoscopic cholecystectomy  Disposition: Home  Patient Instructions:    Medication List    TAKE these medications        atorvastatin 10 MG tablet  Commonly known as:  LIPITOR  Take 10 mg by mouth daily.     HYDROcodone-acetaminophen 5-325 MG tablet  Commonly known as:  NORCO  Take 1-2 tablets by mouth every 6 (six) hours as needed for moderate pain or severe pain.  ibuprofen 200 MG tablet  Commonly known as:  ADVIL,MOTRIN  Take 400 mg by mouth every 6 (six) hours as needed for moderate pain.        Activity: No sports or heavy lifting for 3 weeks.  Otherwise activities as tolerated Diet: low fat, low cholesterol diet Wound Care: none needed  Follow-up:  With [facility] clinic in 3 weeks.  Signed: Edsel Petrin. Dalbert Batman, M.D., FACS General and minimally invasive surgery Breast and Colorectal Surgery  08/13/2015, 9:15 AM

## 2015-08-15 ENCOUNTER — Encounter (HOSPITAL_COMMUNITY): Payer: Self-pay | Admitting: General Surgery

## 2015-08-22 DIAGNOSIS — N2889 Other specified disorders of kidney and ureter: Secondary | ICD-10-CM | POA: Diagnosis not present

## 2015-08-22 DIAGNOSIS — R911 Solitary pulmonary nodule: Secondary | ICD-10-CM | POA: Diagnosis not present

## 2015-08-22 MED FILL — traMADol HCL 50 MG TABS: 50 | 5 days supply | Qty: 30 | Fill #0

## 2015-09-05 MED FILL — ATORVASTATIN 20 MG TABLET: 20 | 90 days supply | Qty: 90 | Fill #0

## 2015-09-08 DIAGNOSIS — C641 Malignant neoplasm of right kidney, except renal pelvis: Secondary | ICD-10-CM | POA: Diagnosis not present

## 2015-09-29 MED FILL — ATORVASTATIN 20 MG TABLET: 20 | 90 days supply | Qty: 90 | Fill #1

## 2015-10-19 ENCOUNTER — Encounter (HOSPITAL_COMMUNITY): Payer: Self-pay

## 2015-10-19 ENCOUNTER — Emergency Department (HOSPITAL_COMMUNITY)
Admission: EM | Admit: 2015-10-19 | Discharge: 2015-10-20 | Disposition: A | Discharge status: Home / Self Care | Payer: 59 | Attending: Dermatology | Admitting: Dermatology

## 2015-10-19 DIAGNOSIS — Y999 Unspecified external cause status: Secondary | ICD-10-CM | POA: Insufficient documentation

## 2015-10-19 DIAGNOSIS — Y939 Activity, unspecified: Secondary | ICD-10-CM | POA: Diagnosis not present

## 2015-10-19 DIAGNOSIS — S0181XA Laceration without foreign body of other part of head, initial encounter: Secondary | ICD-10-CM | POA: Diagnosis not present

## 2015-10-19 DIAGNOSIS — Y92828 Other wilderness area as the place of occurrence of the external cause: Secondary | ICD-10-CM | POA: Insufficient documentation

## 2015-10-19 DIAGNOSIS — Z5321 Procedure and treatment not carried out due to patient leaving prior to being seen by health care provider: Secondary | ICD-10-CM | POA: Diagnosis not present

## 2015-10-19 DIAGNOSIS — W1839XA Other fall on same level, initial encounter: Secondary | ICD-10-CM | POA: Insufficient documentation

## 2015-10-19 NOTE — ED Notes (Signed)
Pt fell down a hill and hit his head on a some rocks tonight, he has a large laceration on his forehead, bleeding controlled now

## 2015-10-20 ENCOUNTER — Ambulatory Visit (HOSPITAL_COMMUNITY): Admission: RE | Admit: 2015-10-20 | Payer: No Typology Code available for payment source | Source: Ambulatory Visit

## 2015-10-20 ENCOUNTER — Emergency Department (HOSPITAL_COMMUNITY)
Admission: EM | Admit: 2015-10-20 | Discharge: 2015-10-20 | Disposition: A | Discharge status: Home / Self Care | Payer: 59 | Attending: Dermatology | Admitting: Dermatology

## 2015-10-20 ENCOUNTER — Ambulatory Visit (INDEPENDENT_AMBULATORY_CARE_PROVIDER_SITE_OTHER): Payer: 59 | Admitting: Physician Assistant

## 2015-10-20 ENCOUNTER — Encounter (HOSPITAL_COMMUNITY): Payer: Self-pay | Admitting: Emergency Medicine

## 2015-10-20 VITALS — BP 120/82 | HR 90 | Temp 98.5°F | Resp 18 | Ht 73.0 in | Wt 177.0 lb

## 2015-10-20 DIAGNOSIS — Y929 Unspecified place or not applicable: Secondary | ICD-10-CM | POA: Diagnosis not present

## 2015-10-20 DIAGNOSIS — Y999 Unspecified external cause status: Secondary | ICD-10-CM | POA: Diagnosis not present

## 2015-10-20 DIAGNOSIS — I10 Essential (primary) hypertension: Secondary | ICD-10-CM | POA: Diagnosis not present

## 2015-10-20 DIAGNOSIS — Z5321 Procedure and treatment not carried out due to patient leaving prior to being seen by health care provider: Secondary | ICD-10-CM | POA: Insufficient documentation

## 2015-10-20 DIAGNOSIS — Y939 Activity, unspecified: Secondary | ICD-10-CM | POA: Diagnosis not present

## 2015-10-20 DIAGNOSIS — Z23 Encounter for immunization: Secondary | ICD-10-CM | POA: Diagnosis not present

## 2015-10-20 DIAGNOSIS — S0101XA Laceration without foreign body of scalp, initial encounter: Secondary | ICD-10-CM | POA: Insufficient documentation

## 2015-10-20 DIAGNOSIS — W010XXA Fall on same level from slipping, tripping and stumbling without subsequent striking against object, initial encounter: Secondary | ICD-10-CM | POA: Insufficient documentation

## 2015-10-20 MED ORDER — CEPHALEXIN 500 MG PO CAPS: 500.0000 mg | ORAL_CAPSULE | Freq: Four times a day (QID) | ORAL | Status: AC

## 2015-10-20 MED ORDER — CEPHALEXIN 500 MG PO CAPS: 500.0000 mg | ORAL_CAPSULE | Freq: Four times a day (QID) | ORAL | Status: DC

## 2015-10-20 NOTE — Patient Instructions (Addendum)
WOUND CARE Please return in 7 days to have your stitches/staples removed or sooner if you have concerns. Marland Kitchen Keep area clean and dry for 24 hours. Do not remove bandage, if applied. . After 24 hours, remove bandage and wash wound gently with mild soap and warm water. Reapply a new bandage after cleaning wound, if directed. . Continue daily cleansing with soap and water until stitches/staples are removed. . Do not apply any ointments or creams to the wound while stitches/staples are in place, as this may cause delayed healing. . Notify the office if you experience any of the following signs of infection: Swelling, redness, pus drainage, streaking, fever >101.0 F . Notify the office if you experience excessive bleeding that does not stop after 15-20 minutes of constant, firm pressure.    IF you received an x-ray today, you will receive an invoice from [facility]. Please contact [facility] at [phone] with questions or concerns regarding your invoice.   IF you received labwork today, you will receive an invoice from [employer]. Please contact [facility] at [phone] with questions or concerns regarding your invoice.   Our billing staff will not be able to assist you with questions regarding bills from these companies.  You will be contacted with the lab results as soon as they are available. The fastest way to get your results is to activate your My Chart account. Instructions are located on the last page of this paperwork. If you have not heard from [place] regarding the results in 2 weeks, please contact this office.

## 2015-10-20 NOTE — Progress Notes (Signed)
   Adam [name]  MRN: [id number] DOB: 26-Jun-1955  Subjective:  Pt presents to clinic with a laceration that occurred last night while he fishing and he tripped and rolled down a back and hit his head on a rock.  He did not have an LOC.  He spent all night going from ED to ED waiting to be seen but leaving prior to being seen because he waited over 2 hours both places.  He currently has a headache and he is tired but he is having no confusion, vision changes or memory problems.  He does not drink ETOH.  Review of Systems  Patient Active Problem List   Diagnosis Date Noted  . Acute cholecystitis 08/12/2015  . Chronic hepatitis C without hepatic coma ([place]) 08/16/2014  . HTN (hypertension) 08/16/2014  . H/O knee surgery 08/16/2014  . Hyperlipidemia 08/16/2014    Current Outpatient Prescriptions on File Prior to Visit  Medication Sig Dispense Refill  . atorvastatin (LIPITOR) 10 MG tablet Take 10 mg by mouth daily.    Marland Kitchen ibuprofen (ADVIL,MOTRIN) 200 MG tablet Take 400 mg by mouth every 6 (six) hours as needed for moderate pain.      No current facility-administered medications on file prior to visit.    No Known Allergies  Objective:  BP 120/82 mmHg  Pulse 90  Temp(Src) 98.5 F (36.9 C) (Oral)  Resp 18  Ht 6\' 1"  (1.854 m)  Wt 177 lb (80.287 kg)  BMI 23.36 kg/m2  SpO2 98%  Physical Exam  Constitutional: He is oriented to person, place, and time and well-developed, well-nourished, and in no distress.  HENT:  Head: Normocephalic and atraumatic.    Eyes: Conjunctivae, EOM and lids are normal. Pupils are equal, round, and reactive to light.  Neck: Normal range of motion.  Pulmonary/Chest: Effort normal.  Neurological: He is alert and oriented to person, place, and time. He has normal strength, normal reflexes and intact cranial nerves. He displays no weakness and normal reflexes. No cranial nerve deficit. He has a normal Cerebellar Exam. Gait normal. Coordination and gait  normal.  Skin: Skin is warm and dry.  Psychiatric: Affect normal.  Vitals reviewed.   PROCEDURE NOTE: laceration repair Verbal consent obtained from patient.  Local anesthesia with 12cc Lidocaine 1% with epinephrine.  Wound explored - portion of skull viewed, minimal abrasion of skull noted. Portion of galea aponeurotica involved. No further damage noted. Wound scrubbed with soap and [name] and rinsed. Galea aponeurotica closed with #7 7-0 Vicryl. Subcutaneous layer closedWound closed with #13 5-0 Prolene simple interrupted and #1 corner sutures.  Wound cleansed and dressed.  Tenna Delaine, PA-C  [facility] 10/20/2015 5:49 PM      Assessment and Plan :  Scalp laceration, initial encounter  Need for Tdap vaccination - Plan: Tdap vaccine greater than or equal to 7yo IM  -Return to clinic in 7 days for removal of sutures -Keflex 500mg  QID x 7 days -Wound care instructions given  Head injury precautions given to patient - he as instructed to rest and keep the wound clean.  We discussed head injury precautions and to take it easy for a few days.  If his headaches worsen or if he has worsening symptoms he needs to seek further evaluation.   Windell Hummingbird PA-C  [facility] 10/20/2015 3:40 PM

## 2015-10-20 NOTE — ED Notes (Signed)
Pt. presents with scalp laceration approx 2" with minimal bleeding , pt. stated he tripped and fell forward this evening , denies loc , ambulatory . Dressing applied at triage .

## 2015-10-20 NOTE — ED Notes (Signed)
Pt walked out the door and stated that he was tired of waiting and it was explained to him that a CT was ordered, he proceeded to walk out the door anyway

## 2015-10-20 NOTE — ED Notes (Addendum)
Pt ripped armband off and came and layed armband and labels on desk at nurse [facility].  Pt states, "I have been here for over 2 hours and fell 3 hours before that.  Y'all aren't worried about it."  Attempted to talk to pt and he walked out.  Pt had been here 1 hour 24min and there were multiple patients ahead of him to be seen based on time and acuity.

## 2015-10-22 ENCOUNTER — Emergency Department (HOSPITAL_COMMUNITY): Payer: 59

## 2015-10-22 ENCOUNTER — Encounter (HOSPITAL_COMMUNITY): Payer: Self-pay

## 2015-10-22 ENCOUNTER — Emergency Department (HOSPITAL_COMMUNITY)
Admission: EM | Admit: 2015-10-22 | Discharge: 2015-10-22 | Disposition: A | Discharge status: Home / Self Care | Payer: 59 | Attending: Emergency Medicine | Admitting: Emergency Medicine

## 2015-10-22 DIAGNOSIS — Y939 Activity, unspecified: Secondary | ICD-10-CM | POA: Diagnosis not present

## 2015-10-22 DIAGNOSIS — S63511A Sprain of carpal joint of right wrist, initial encounter: Secondary | ICD-10-CM | POA: Diagnosis not present

## 2015-10-22 DIAGNOSIS — S0990XD Unspecified injury of head, subsequent encounter: Secondary | ICD-10-CM | POA: Diagnosis not present

## 2015-10-22 DIAGNOSIS — Y92524 Gas station as the place of occurrence of the external cause: Secondary | ICD-10-CM | POA: Insufficient documentation

## 2015-10-22 DIAGNOSIS — Y999 Unspecified external cause status: Secondary | ICD-10-CM | POA: Diagnosis not present

## 2015-10-22 DIAGNOSIS — S52501A Unspecified fracture of the lower end of right radius, initial encounter for closed fracture: Secondary | ICD-10-CM | POA: Diagnosis not present

## 2015-10-22 DIAGNOSIS — S59911A Unspecified injury of right forearm, initial encounter: Secondary | ICD-10-CM | POA: Diagnosis present

## 2015-10-22 DIAGNOSIS — M25331 Other instability, right wrist: Secondary | ICD-10-CM

## 2015-10-22 DIAGNOSIS — R220 Localized swelling, mass and lump, head: Secondary | ICD-10-CM | POA: Diagnosis not present

## 2015-10-22 DIAGNOSIS — Z79899 Other long term (current) drug therapy: Secondary | ICD-10-CM | POA: Insufficient documentation

## 2015-10-22 DIAGNOSIS — S0990XA Unspecified injury of head, initial encounter: Secondary | ICD-10-CM | POA: Diagnosis not present

## 2015-10-22 DIAGNOSIS — R42 Dizziness and giddiness: Secondary | ICD-10-CM | POA: Diagnosis not present

## 2015-10-22 DIAGNOSIS — W010XXA Fall on same level from slipping, tripping and stumbling without subsequent striking against object, initial encounter: Secondary | ICD-10-CM | POA: Diagnosis not present

## 2015-10-22 DIAGNOSIS — S52591A Other fractures of lower end of right radius, initial encounter for closed fracture: Secondary | ICD-10-CM | POA: Diagnosis not present

## 2015-10-22 DIAGNOSIS — I10 Essential (primary) hypertension: Secondary | ICD-10-CM | POA: Diagnosis not present

## 2015-10-22 MED ORDER — OXYCODONE-ACETAMINOPHEN 5-325 MG PO TABS: 1.0000 | ORAL_TABLET | Freq: Four times a day (QID) | ORAL | Status: DC | PRN

## 2015-10-22 MED ORDER — OXYCODONE-ACETAMINOPHEN 5-325 MG PO TABS
2.0000 | ORAL_TABLET | Freq: Once | ORAL | Status: AC
  Administered 2015-10-22: 2 via ORAL
  Filled 2015-10-22: qty 2

## 2015-10-22 NOTE — ED Provider Notes (Signed)
CSN: [id number]     Arrival date & time 10/22/15  0732 History   First MD Initiated Contact with Patient 10/22/15 [phone]     Chief Complaint  Patient presents with  . Arm Injury     (Consider location/radiation/quality/duration/timing/severity/associated sxs/prior Treatment) HPI Patient presents after slip and fall onto extended right arm this morning at gas station. Complains of right wrist pain and deformity. Decreased range of motion. Denies head or neck injury or loss of consciousness. Patient did have sudden fall while fishing onto rocks on 7/19. Systane head injury with forehead laceration. Came into the emergency department but left prior to being evaluated. Was seen at [facility] care. Had wound repaired but no imaging. Denies being on any blood thinners. States he's been off balance since the fall. Denies any headaches or visual changes. No focal weakness or numbness. Past Medical History  Diagnosis Date  . Chronic hepatitis C without hepatic coma ([place]) 08/16/2014  . HTN (hypertension) 08/16/2014  . H/O knee surgery 08/16/2014  . Hyperlipidemia 08/16/2014  . Arthritis    Past Surgical History  Procedure Laterality Date  . Cholecystectomy N/A 08/12/2015    Procedure: LAPAROSCOPIC CHOLECYSTECTOMY WITH INTRAOPERATIVE CHOLANGIOGRAM;  Surgeon: Ralene Ok, MD;  Location: [place];  Service: General;  Laterality: N/A;   Family History  Problem Relation Age of Onset  . Heart disease Mother   . Hyperlipidemia Mother    Social History  Substance Use Topics  . Smoking status: Never Smoker   . Smokeless tobacco: Never Used  . Alcohol Use: No    Review of Systems  Constitutional: Negative for fever and chills.  HENT: Negative for facial swelling.   Eyes: Negative for visual disturbance.  Respiratory: Negative for cough and shortness of breath.   Cardiovascular: Negative for chest pain.  Gastrointestinal: Negative for nausea, vomiting, abdominal pain, diarrhea and constipation.   Musculoskeletal: Positive for arthralgias. Negative for neck pain and neck stiffness.  Skin: Positive for wound.  Neurological: Positive for dizziness and light-headedness. Negative for syncope, weakness, numbness and headaches.  All other systems reviewed and are negative.     Allergies  Review of patient's allergies indicates no known allergies.  Home Medications   Prior to Admission medications   Medication Sig Start Date End Date Taking? Authorizing Provider  atorvastatin (LIPITOR) 10 MG tablet Take 10 mg by mouth daily.    Historical Provider, MD  cephALEXin (KEFLEX) 500 MG capsule Take 1 capsule (500 mg total) by mouth 4 (four) times daily. 10/20/15 10/30/15  Leonie Douglas, PA-C  ibuprofen (ADVIL,MOTRIN) 200 MG tablet Take 400 mg by mouth every 6 (six) hours as needed for moderate pain.     Historical Provider, MD   BP 139/79 mmHg  Pulse 67  Temp(Src) 97.9 F (36.6 C) (Oral)  Resp 22  Wt 173 lb 14.4 oz (78.881 kg)  SpO2 100% Physical Exam  Constitutional: He is oriented to person, place, and time. He appears well-developed and well-nourished. No distress.  HENT:  Head: Normocephalic.  Mouth/Throat: Oropharynx is clear and moist.  Patient with 10 cm angulated laceration through the midforehead. Repaired and suture edges are intact. There is no erythema, warmth or discharge from the site. No underlying bony deformity  Eyes: EOM are normal. Pupils are equal, round, and reactive to light.  No nystagmus  Neck: Normal range of motion. Neck supple.  No posterior midline cervical tenderness to palpation.  Cardiovascular: Normal rate and regular rhythm.  Exam reveals no gallop and no friction  rub.   No murmur heard. Pulmonary/Chest: Effort normal and breath sounds normal. No respiratory distress. He has no wheezes. He has no rales. He exhibits no tenderness.  Abdominal: Soft. Bowel sounds are normal. He exhibits no distension and no mass. There is no tenderness. There is no  rebound and no guarding.  Musculoskeletal: He exhibits tenderness. He exhibits no edema.  Patient with dinner fork deformity to the right wrist. Diffuse tenderness to palpation. Full range of motion of the right elbow and shoulder without pain, swelling or deformity. Good distal cap refill.  Neurological: He is alert and oriented to person, place, and time.  5/5 grip strength bilaterally. 5/5 motor in all extremities proximally. Sensation fully intact. Bilateral finger to nose intact.  Skin: Skin is warm and dry. No rash noted. No erythema.  Psychiatric: He has a normal mood and affect. His behavior is normal.  Nursing note and vitals reviewed.   ED Course  Procedures (including critical care time) Labs Review Labs Reviewed - No data to display  Imaging Review Dg Wrist Complete Right  10/22/2015  CLINICAL DATA:  Fall, landed on right hand, pain in obvious deformity, initial encounter. EXAM: RIGHT WRIST - COMPLETE 3+ VIEW COMPARISON:  Right hand 11/22/2007. FINDINGS: Mildly impacted and comminuted fracture of the distal radial metaphysis. Fracture lines extend to the radiocarpal joint. Mild apex volar angulation. Associated widening of the scapholunate interval. Minimally displaced ulnar styloid avulsion fracture. Small osseous density associated with the distal scaphoid appears well corticated and is unchanged. Surrounding soft tissue swelling. Degenerative changes at the first carpometacarpal and scaphoid trapezium trapezoid joints. IMPRESSION: 1. Mildly impacted and comminuted distal radius fracture with extension to the radiocarpal joint. 2. Widening of the scapholunate interval, indicative of scapholunate dissociation. 3. Minimally displaced ulnar styloid avulsion fracture. 4. First carpometacarpal and STT joint osteoarthritis. Electronically Signed   By: Lorin Picket M.D.   On: 10/22/2015 08:47   Ct Head Wo Contrast  10/22/2015  CLINICAL DATA:  [place] face down with laceration to the  forehead. Blurred vision. Injury occurred 7 hours ago. EXAM: CT HEAD WITHOUT CONTRAST TECHNIQUE: Contiguous axial images were obtained from the base of the skull through the vertex without intravenous contrast. COMPARISON:  None. FINDINGS: There is soft tissue swelling of the forehead. The brain has a normal appearance without evidence of malformation, atrophy, old or acute infarction, mass lesion, hemorrhage, hydrocephalus or extra-axial collection. The calvarium is unremarkable. There is chronic opacification of the right maxillary sinus. IMPRESSION: Forehead swelling without underlying skull fracture or intracranial injury. Chronic inflammation of the right maxillary sinus. Electronically Signed   By: Nelson Chimes M.D.   On: 10/22/2015 08:49   I have personally reviewed and evaluated these images and lab results as part of my medical decision-making.   EKG Interpretation None      MDM   Final diagnoses:  Distal radius fracture, right, closed, initial encounter  Scapho-lunate dissociation, right  Closed head injury, subsequent encounter   Patient with closed head injury 3-4 days ago. Off balance and dizzy since. Acute right wrist injury. X-ray with evidence of distal radius fracture extending into the joint and scapholunate dissociation. Placed in thumb spica short arm splint.  Advised to call hand surgery on Monday to arrange follow-up. Understands need return for increasing pain, swelling, decreased range of motion, numbness or any concerns.  No evidence of intracranial injury. Likely sustained concussion. Given concussion precautions.  Julianne Rice, MD 10/22/15 0930

## 2015-10-22 NOTE — Discharge Instructions (Signed)
Wrist Fracture °A wrist fracture is a break or crack in one of the bones of your wrist. Your wrist is made up of eight small bones at the palm of your hand (carpal bones) and two long bones that make up your forearm (radius and ulna). °CAUSES °· A direct blow to the wrist. °· Falling on an outstretched hand. °· Trauma, such as a car accident or a fall. °RISK FACTORS °Risk factors for wrist fracture include: °· Participating in contact and high-risk sports, such as skiing, biking, and ice skating. °· Taking steroid medicines. °· Smoking. °· Being male. °· Being Caucasian. °· Drinking more than three alcoholic beverages per day. °· Having low or lowered bone density (osteoporosis or osteopenia). °· Age. Older adults have decreased bone density. °· Women who have had menopause. °· History of previous fractures. °SIGNS AND SYMPTOMS °Symptoms of wrist fractures include tenderness, bruising, and inflammation. Additionally, the wrist may hang in an odd position or appear deformed. °DIAGNOSIS °Diagnosis may include: °· Physical exam. °· X-ray. °TREATMENT °Treatment depends on many factors, including the nature and location of the fracture, your age, and your activity level. Treatment for wrist fracture can be nonsurgical or surgical. °Nonsurgical Treatment °A plaster cast or splint may be applied to your wrist if the bone is in a good position. If the fracture is not in good position, it may be necessary for your health care provider to realign it before applying a splint or cast. Usually, a cast or splint will be worn for several weeks. °Surgical Treatment °Sometimes the position of the bone is so far out of place that surgery is required to apply a device to hold it together as it heals. Depending on the fracture, there are a number of options for holding the bone in place while it heals, such as a cast and metal pins. °HOME CARE INSTRUCTIONS °· Keep your injured wrist elevated and move your fingers as much as  possible. °· Do not put pressure on any part of your cast or splint. It may break. °· Use a plastic bag to protect your cast or splint from water while bathing or showering. Do not lower your cast or splint into water. °· Take medicines only as directed by your health care provider. °· Keep your cast or splint clean and dry. If it becomes wet, damaged, or suddenly feels too tight, contact your health care provider right away. °· Do not use any tobacco products including cigarettes, chewing tobacco, or electronic cigarettes. Tobacco can delay bone healing. If you need help quitting, ask your health care provider. °· Keep all follow-up visits as directed by your health care provider. This is important. °· Ask your health care provider if you should take supplements of calcium and vitamins C and D to promote bone healing. °SEEK MEDICAL CARE IF: °· Your cast or splint is damaged, breaks, or gets wet. °· You have a fever. °· You have chills. °· You have continued severe pain or more swelling than you did before the cast was put on. °SEEK IMMEDIATE MEDICAL CARE IF: °· Your hand or fingernails on the injured arm turn blue or gray, or feel cold or numb. °· You have decreased feeling in the fingers of your injured arm. °MAKE SURE YOU: °· Understand these instructions. °· Will watch your condition. °· Will get help right away if you are not doing well or get worse. °  °This information is not intended to replace advice given to you by your   health care provider. Make sure you discuss any questions you have with your health care provider.   Document Released: 12/27/2004 Document Revised: 12/08/2014 Document Reviewed: 04/06/2011 Elsevier Interactive Patient Education 2016 [place] Injury, Adult You have received a head injury. It does not appear serious at this time. Headaches and vomiting are common following head injury. It should be easy to awaken from sleeping. Sometimes it is necessary for you to stay in the  emergency department for a while for observation. Sometimes admission to the hospital may be needed. After injuries such as yours, most problems occur within the first 24 hours, but side effects may occur up to 7-10 days after the injury. It is important for you to carefully monitor your condition and contact your health care provider or seek immediate medical care if there is a change in your condition. WHAT ARE THE TYPES OF HEAD INJURIES? Head injuries can be as minor as a bump. Some head injuries can be more severe. More severe head injuries include:  A jarring injury to the brain (concussion).  A bruise of the brain (contusion). This mean there is bleeding in the brain that can cause swelling.  A cracked skull (skull fracture).  Bleeding in the brain that collects, clots, and forms a bump (hematoma). WHAT CAUSES A HEAD INJURY? A serious head injury is most likely to happen to someone who is in a car wreck and is not wearing a seat belt. Other causes of major head injuries include bicycle or motorcycle accidents, sports injuries, and falls. HOW ARE HEAD INJURIES DIAGNOSED? A complete history of the event leading to the injury and your current symptoms will be helpful in diagnosing head injuries. Many times, pictures of the brain, such as CT or MRI are needed to see the extent of the injury. Often, an overnight hospital stay is necessary for observation.  WHEN SHOULD I SEEK IMMEDIATE MEDICAL CARE?  You should get help right away if:  You have confusion or drowsiness.  You feel sick to your stomach (nauseous) or have continued, forceful vomiting.  You have dizziness or unsteadiness that is getting worse.  You have severe, continued headaches not relieved by medicine. Only take over-the-counter or prescription medicines for pain, fever, or discomfort as directed by your health care provider.  You do not have normal function of the arms or legs or are unable to walk.  You notice changes in  the black spots in the center of the colored part of your eye (pupil).  You have a clear or bloody fluid coming from your nose or ears.  You have a loss of vision. During the next 24 hours after the injury, you must stay with someone who can watch you for the warning signs. This person should contact local emergency services (911 in the [place] if you have seizures, you become unconscious, or you are unable to wake up. HOW CAN I PREVENT A HEAD INJURY IN THE FUTURE? The most important factor for preventing major head injuries is avoiding motor vehicle accidents. To minimize the potential for damage to your head, it is crucial to wear seat belts while riding in motor vehicles. Wearing helmets while bike riding and playing collision sports (like football) is also helpful. Also, avoiding dangerous activities around the house will further help reduce your risk of head injury.  WHEN CAN I RETURN TO NORMAL ACTIVITIES AND ATHLETICS? You should be reevaluated by your health care provider before returning to these activities. If you have any  of the following symptoms, you should not return to activities or contact sports until 1 week after the symptoms have stopped:  Persistent headache.  Dizziness or vertigo.  Poor attention and concentration.  Confusion.  Memory problems.  Nausea or vomiting.  Fatigue or tire easily.  Irritability.  Intolerant of bright lights or loud noises.  Anxiety or depression.  Disturbed sleep. MAKE SURE YOU:   Understand these instructions.  Will watch your condition.  Will get help right away if you are not doing well or get worse.   This information is not intended to replace advice given to you by your health care provider. Make sure you discuss any questions you have with your health care provider.   Document Released: 03/19/2005 Document Revised: 04/09/2014 Document Reviewed: 11/24/2012 Elsevier Interactive Patient Education [employer].

## 2015-10-22 NOTE — Progress Notes (Signed)
Orthopedic Tech Progress Note Patient Details:  Adam [name] [birth year] [id number]  Ortho Devices Type of Ortho Device: Ace wrap, Arm sling, Sugartong splint Ortho Device/Splint Location: rue Ortho Device/Splint Interventions: Application   Waynette Towers 10/22/2015, 9:26 AM

## 2015-10-22 NOTE — ED Notes (Signed)
Ortho tech notified of order for sugar tong splint. Verbalizes on the way to apply.

## 2015-10-22 NOTE — ED Notes (Signed)
Patient slipped on oil at gas station this am. Here with right wrist forearm pain after trying to catch self. Swelling noted to wrist. Positive distal pulse

## 2015-10-24 ENCOUNTER — Telehealth (HOSPITAL_BASED_OUTPATIENT_CLINIC_OR_DEPARTMENT_OTHER): Payer: Self-pay | Admitting: Emergency Medicine

## 2015-10-24 DIAGNOSIS — S52571A Other intraarticular fracture of lower end of right radius, initial encounter for closed fracture: Secondary | ICD-10-CM | POA: Diagnosis not present

## 2015-10-25 ENCOUNTER — Encounter (HOSPITAL_BASED_OUTPATIENT_CLINIC_OR_DEPARTMENT_OTHER): Payer: Self-pay | Admitting: *Deleted

## 2015-10-25 ENCOUNTER — Other Ambulatory Visit: Payer: Self-pay | Admitting: Orthopedic Surgery

## 2015-10-26 ENCOUNTER — Encounter (HOSPITAL_BASED_OUTPATIENT_CLINIC_OR_DEPARTMENT_OTHER): Payer: Self-pay | Admitting: Certified Registered"

## 2015-10-26 ENCOUNTER — Ambulatory Visit (HOSPITAL_BASED_OUTPATIENT_CLINIC_OR_DEPARTMENT_OTHER)
Admission: RE | Admit: 2015-10-26 | Discharge: 2015-10-26 | Disposition: A | Discharge status: Home / Self Care | Payer: 59 | Source: Ambulatory Visit | Attending: Orthopedic Surgery | Admitting: Orthopedic Surgery

## 2015-10-26 ENCOUNTER — Ambulatory Visit (HOSPITAL_BASED_OUTPATIENT_CLINIC_OR_DEPARTMENT_OTHER): Payer: 59 | Admitting: Anesthesiology

## 2015-10-26 ENCOUNTER — Encounter (HOSPITAL_BASED_OUTPATIENT_CLINIC_OR_DEPARTMENT_OTHER)
Admission: RE | Disposition: A | Discharge status: Home / Self Care | Payer: Self-pay | Source: Ambulatory Visit | Attending: Orthopedic Surgery

## 2015-10-26 DIAGNOSIS — X58XXXA Exposure to other specified factors, initial encounter: Secondary | ICD-10-CM | POA: Insufficient documentation

## 2015-10-26 DIAGNOSIS — S52501A Unspecified fracture of the lower end of right radius, initial encounter for closed fracture: Secondary | ICD-10-CM | POA: Diagnosis not present

## 2015-10-26 DIAGNOSIS — Z9049 Acquired absence of other specified parts of digestive tract: Secondary | ICD-10-CM | POA: Diagnosis not present

## 2015-10-26 DIAGNOSIS — S52571A Other intraarticular fracture of lower end of right radius, initial encounter for closed fracture: Secondary | ICD-10-CM | POA: Insufficient documentation

## 2015-10-26 DIAGNOSIS — E785 Hyperlipidemia, unspecified: Secondary | ICD-10-CM | POA: Diagnosis not present

## 2015-10-26 HISTORY — PX: OPEN REDUCTION INTERNAL FIXATION (ORIF) DISTAL RADIAL FRACTURE: SHX5989

## 2015-10-26 SURGERY — OPEN REDUCTION INTERNAL FIXATION (ORIF) DISTAL RADIUS FRACTURE
Anesthesia: General | Site: Wrist | Laterality: Right

## 2015-10-26 MED ORDER — LACTATED RINGERS IV SOLN
INTRAVENOUS | Status: DC
  Administered 2015-10-26 (×2): via INTRAVENOUS

## 2015-10-26 MED ORDER — DEXAMETHASONE SODIUM PHOSPHATE 10 MG/ML IJ SOLN
INTRAMUSCULAR | Status: DC | PRN
  Administered 2015-10-26: 10 mg via INTRAVENOUS

## 2015-10-26 MED ORDER — ONDANSETRON HCL 4 MG/2ML IJ SOLN
INTRAMUSCULAR | Status: DC | PRN
  Administered 2015-10-26: 4 mg via INTRAVENOUS

## 2015-10-26 MED ORDER — LIDOCAINE 2% (20 MG/ML) 5 ML SYRINGE
INTRAMUSCULAR | Status: AC
  Filled 2015-10-26: qty 5

## 2015-10-26 MED ORDER — OXYCODONE-ACETAMINOPHEN 5-325 MG PO TABS: 1.0000 | ORAL_TABLET | ORAL | 0 refills | Status: DC | PRN

## 2015-10-26 MED ORDER — MIDAZOLAM HCL 2 MG/2ML IJ SOLN
INTRAMUSCULAR | Status: AC
  Filled 2015-10-26: qty 2

## 2015-10-26 MED ORDER — PROPOFOL 500 MG/50ML IV EMUL
INTRAVENOUS | Status: AC
  Filled 2015-10-26: qty 50

## 2015-10-26 MED ORDER — HYDROMORPHONE HCL 1 MG/ML IJ SOLN
0.2500 mg | INTRAMUSCULAR | Status: DC | PRN
  Administered 2015-10-26 (×4): 0.5 mg via INTRAVENOUS

## 2015-10-26 MED ORDER — ONDANSETRON HCL 4 MG/2ML IJ SOLN
INTRAMUSCULAR | Status: AC
  Filled 2015-10-26: qty 2

## 2015-10-26 MED ORDER — GLYCOPYRROLATE 0.2 MG/ML IJ SOLN
0.2000 mg | Freq: Once | INTRAMUSCULAR | Status: DC | PRN
Start: 2015-10-26 — End: 2015-10-26

## 2015-10-26 MED ORDER — DEXAMETHASONE SODIUM PHOSPHATE 10 MG/ML IJ SOLN
INTRAMUSCULAR | Status: AC
  Filled 2015-10-26: qty 1

## 2015-10-26 MED ORDER — LIDOCAINE HCL (PF) 1 % IJ SOLN
INTRAMUSCULAR | Status: AC
  Filled 2015-10-26: qty 30

## 2015-10-26 MED ORDER — ONDANSETRON HCL 4 MG/2ML IJ SOLN: 4.0000 mg | Freq: Four times a day (QID) | INTRAMUSCULAR | Status: DC | PRN

## 2015-10-26 MED ORDER — CHLORHEXIDINE GLUCONATE 4 % EX LIQD: 60.0000 mL | Freq: Once | CUTANEOUS | Status: DC

## 2015-10-26 MED ORDER — PROPOFOL 10 MG/ML IV BOLUS
INTRAVENOUS | Status: DC | PRN
  Administered 2015-10-26: 200 mg via INTRAVENOUS

## 2015-10-26 MED ORDER — OXYCODONE HCL 5 MG/5ML PO SOLN
5.0000 mg | Freq: Once | ORAL | Status: AC | PRN
Start: 2015-10-26 — End: 2015-10-26

## 2015-10-26 MED ORDER — BUPIVACAINE HCL (PF) 0.25 % IJ SOLN
INTRAMUSCULAR | Status: DC | PRN
  Administered 2015-10-26: 20 mL

## 2015-10-26 MED ORDER — BUPIVACAINE HCL (PF) 0.25 % IJ SOLN
INTRAMUSCULAR | Status: AC
  Filled 2015-10-26: qty 90

## 2015-10-26 MED ORDER — MIDAZOLAM HCL 2 MG/2ML IJ SOLN
1.0000 mg | INTRAMUSCULAR | Status: DC | PRN
  Administered 2015-10-26: 2 mg via INTRAVENOUS

## 2015-10-26 MED ORDER — SCOPOLAMINE 1 MG/3DAYS TD PT72: 1.0000 | MEDICATED_PATCH | Freq: Once | TRANSDERMAL | Status: DC | PRN

## 2015-10-26 MED ORDER — OXYCODONE HCL 5 MG PO TABS
ORAL_TABLET | ORAL | Status: AC
  Filled 2015-10-26: qty 1

## 2015-10-26 MED ORDER — OXYCODONE HCL 5 MG PO TABS
5.0000 mg | ORAL_TABLET | Freq: Once | ORAL | Status: AC | PRN
  Administered 2015-10-26: 5 mg via ORAL

## 2015-10-26 MED ORDER — FENTANYL CITRATE (PF) 100 MCG/2ML IJ SOLN
50.0000 ug | INTRAMUSCULAR | Status: AC | PRN
  Administered 2015-10-26 (×2): 50 ug via INTRAVENOUS
  Administered 2015-10-26: 25 ug via INTRAVENOUS
  Administered 2015-10-26: 50 ug via INTRAVENOUS

## 2015-10-26 MED ORDER — CEFAZOLIN SODIUM-DEXTROSE 2-4 GM/100ML-% IV SOLN
2.0000 g | INTRAVENOUS | Status: AC
  Administered 2015-10-26: 2 g via INTRAVENOUS

## 2015-10-26 MED ORDER — BUPIVACAINE-EPINEPHRINE (PF) 0.25% -1:200000 IJ SOLN
INTRAMUSCULAR | Status: AC
  Filled 2015-10-26: qty 30

## 2015-10-26 MED ORDER — HYDROMORPHONE HCL 1 MG/ML IJ SOLN
INTRAMUSCULAR | Status: AC
  Filled 2015-10-26: qty 1

## 2015-10-26 MED ORDER — FENTANYL CITRATE (PF) 100 MCG/2ML IJ SOLN
INTRAMUSCULAR | Status: AC
  Filled 2015-10-26: qty 2

## 2015-10-26 MED ORDER — LIDOCAINE HCL (CARDIAC) 20 MG/ML IV SOLN
INTRAVENOUS | Status: DC | PRN
  Administered 2015-10-26: 60 mg via INTRAVENOUS

## 2015-10-26 MED ORDER — CEFAZOLIN SODIUM-DEXTROSE 2-4 GM/100ML-% IV SOLN
INTRAVENOUS | Status: AC
  Filled 2015-10-26: qty 100

## 2015-10-26 SURGICAL SUPPLY — 77 items
APL SKNCLS STERI-STRIP NONHPOA (GAUZE/BANDAGES/DRESSINGS) ×1
BANDAGE ACE 3X5.8 VEL STRL LF (GAUZE/BANDAGES/DRESSINGS) ×1 IMPLANT
BANDAGE ACE 4X5 VEL STRL LF (GAUZE/BANDAGES/DRESSINGS) ×2 IMPLANT
BENZOIN TINCTURE PRP APPL 2/3 (GAUZE/BANDAGES/DRESSINGS) ×1 IMPLANT
BIT DRILL 2 FAST STEP (BIT) ×1 IMPLANT
BIT DRILL 2.5X4 QC (BIT) ×1 IMPLANT
BLADE MINI RND TIP GREEN BEAV (BLADE) IMPLANT
BLADE SURG 15 STRL LF DISP TIS (BLADE) ×2 IMPLANT
BLADE SURG 15 STRL SS (BLADE) ×2
BNDG CMPR 9X4 STRL LF SNTH (GAUZE/BANDAGES/DRESSINGS) ×1
BNDG ESMARK 4X9 LF (GAUZE/BANDAGES/DRESSINGS) ×1 IMPLANT
BNDG GAUZE ELAST 4 BULKY (GAUZE/BANDAGES/DRESSINGS) ×2 IMPLANT
CANISTER SUCT 1200ML W/VALVE (MISCELLANEOUS) IMPLANT
CORDS BIPOLAR (ELECTRODE) ×2 IMPLANT
COVER BACK TABLE 60X90IN (DRAPES) ×2 IMPLANT
CUFF TOURNIQUET SINGLE 18IN (TOURNIQUET CUFF) ×1 IMPLANT
DECANTER SPIKE VIAL GLASS SM (MISCELLANEOUS) IMPLANT
DRAPE EXTREMITY T 121X128X90 (DRAPE) ×2 IMPLANT
DRAPE OEC MINIVIEW 54X84 (DRAPES) ×2 IMPLANT
DRAPE SURG 17X23 STRL (DRAPES) ×2 IMPLANT
DURAPREP 26ML APPLICATOR (WOUND CARE) ×2 IMPLANT
ELECT REM PT RETURN 9FT ADLT (ELECTROSURGICAL)
ELECTRODE REM PT RTRN 9FT ADLT (ELECTROSURGICAL) IMPLANT
GAUZE SPONGE 4X4 12PLY STRL (GAUZE/BANDAGES/DRESSINGS) ×2 IMPLANT
GAUZE SPONGE 4X4 16PLY XRAY LF (GAUZE/BANDAGES/DRESSINGS) IMPLANT
GAUZE XEROFORM 1X8 LF (GAUZE/BANDAGES/DRESSINGS) IMPLANT
GLOVE BIO SURGEON STRL SZ 6.5 (GLOVE) ×1 IMPLANT
GLOVE BIO SURGEON STRL SZ8.5 (GLOVE) ×1 IMPLANT
GLOVE BIOGEL PI IND STRL 7.0 (GLOVE) IMPLANT
GLOVE BIOGEL PI IND STRL 8 (GLOVE) IMPLANT
GLOVE BIOGEL PI INDICATOR 7.0 (GLOVE) ×1
GLOVE BIOGEL PI INDICATOR 8 (GLOVE) ×1
GLOVE SURG SYN 8.0 (GLOVE) ×2 IMPLANT
GLOVE SURG SYN 8.0 PF PI (GLOVE) ×1 IMPLANT
GOWN STRL REUS W/ TWL LRG LVL3 (GOWN DISPOSABLE) ×1 IMPLANT
GOWN STRL REUS W/TWL LRG LVL3 (GOWN DISPOSABLE) ×2
GOWN STRL REUS W/TWL XL LVL3 (GOWN DISPOSABLE) ×4 IMPLANT
NDL HYPO 25X1 1.5 SAFETY (NEEDLE) ×1 IMPLANT
NEEDLE HYPO 25X1 1.5 SAFETY (NEEDLE) ×2 IMPLANT
NS IRRIG 1000ML POUR BTL (IV SOLUTION) ×2 IMPLANT
PACK BASIN DAY SURGERY FS (CUSTOM PROCEDURE TRAY) ×2 IMPLANT
PAD CAST 3X4 CTTN HI CHSV (CAST SUPPLIES) ×1 IMPLANT
PAD CAST 4YDX4 CTTN HI CHSV (CAST SUPPLIES) IMPLANT
PADDING CAST COTTON 3X4 STRL (CAST SUPPLIES) ×2
PADDING CAST COTTON 4X4 STRL (CAST SUPPLIES)
PEG SUBCHONDRAL SMOOTH 2.0X24 (Peg) ×1 IMPLANT
PEG SUBCHONDRAL SMOOTH 2.0X26 (Peg) ×5 IMPLANT
PEG SUBCHONDRAL SMOOTH 2.0X28 (Peg) ×3 IMPLANT
PENCIL BUTTON HOLSTER BLD 10FT (ELECTRODE) IMPLANT
PLATE WIDE 28.2X62.6 LT (Plate) ×1 IMPLANT
SCREW CORT 3.5X14 LNG (Screw) ×1 IMPLANT
SCREW CORT 3.5X16 LNG (Screw) ×2 IMPLANT
SHEET MEDIUM DRAPE 40X70 STRL (DRAPES) ×2 IMPLANT
SLEEVE SCD COMPRESS KNEE MED (MISCELLANEOUS) ×2 IMPLANT
SPLINT PLASTER CAST XFAST 3X15 (CAST SUPPLIES) IMPLANT
SPLINT PLASTER CAST XFAST 4X15 (CAST SUPPLIES) ×5 IMPLANT
SPLINT PLASTER XTRA FAST SET 4 (CAST SUPPLIES) ×5
SPLINT PLASTER XTRA FASTSET 3X (CAST SUPPLIES)
STOCKINETTE 4X48 STRL (DRAPES) ×2 IMPLANT
STRIP CLOSURE SKIN 1/2X4 (GAUZE/BANDAGES/DRESSINGS) IMPLANT
SUCTION FRAZIER HANDLE 10FR (MISCELLANEOUS)
SUCTION TUBE FRAZIER 10FR DISP (MISCELLANEOUS) IMPLANT
SUT CHROMIC 3 0 PS 2 (SUTURE) IMPLANT
SUT ETHILON 4 0 PS 2 18 (SUTURE) IMPLANT
SUT MERSILENE 4 0 P 3 (SUTURE) IMPLANT
SUT PROLENE 3 0 PS 2 (SUTURE) ×2 IMPLANT
SUT VIC AB 0 SH 27 (SUTURE) IMPLANT
SUT VIC AB 2-0 PS2 27 (SUTURE) ×1 IMPLANT
SUT VIC AB 3-0 FS2 27 (SUTURE) IMPLANT
SUT VIC AB 4-0 RB1 18 (SUTURE) IMPLANT
SUT VICRYL RAPIDE 4-0 (SUTURE) IMPLANT
SUT VICRYL RAPIDE 4/0 PS 2 (SUTURE) IMPLANT
SYR BULB 3OZ (MISCELLANEOUS) ×2 IMPLANT
SYRINGE 10CC LL (SYRINGE) ×2 IMPLANT
TOWEL OR 17X24 6PK STRL BLUE (TOWEL DISPOSABLE) ×2 IMPLANT
TUBE CONNECTING 20X1/4 (TUBING) IMPLANT
UNDERPAD 30X30 (UNDERPADS AND DIAPERS) ×2 IMPLANT

## 2015-10-26 NOTE — Op Note (Signed)
Adam [name], CERDA              ACCOUNT NO.:  [id number]  MEDICAL RECORD NO.:  [id number]  LOCATION:                                 FACILITY:  PHYSICIAN:  Sheral Apley. Christyana Corwin, M.D.DATE OF BIRTH:  1956/03/26  DATE OF PROCEDURE:  10/26/2015 DATE OF DISCHARGE:                              OPERATIVE REPORT   PREOPERATIVE DIAGNOSIS:  Displaced intra-articular fracture, 4 part, right distal radius.  POSTOPERATIVE DIAGNOSIS:  Displaced intra-articular fracture, 4 part, right distal radius.  PROCEDURE:  Open reduction and internal fixation above using wide DVR plate and screws.  SURGEON:  Sheral Apley. Burney Gauze, MD  ASSISTANT:  None.  ANESTHESIA:  General.  COMPLICATION:  No complication.  DRAINS:  No drains.  PROCEDURE IN DETAIL:  The patient was taken to the operating suite after induction of adequate general anesthetic.  The right upper extremity was prepped and draped in the usual sterile fashion.  An Esmarch was used to exsanguinate the limb and tourniquet was then inflated to 250 mmHg.  At this point in time, incision was made in the palmar aspect of the right hand and wrist over the palpable border of the flexor carpi radialis tendon.  Skin was incised sharply.  The sheath overlying the FCR was incised.  The FCR was retracted to the midline, the radial artery to the lateral side.  The fascia underlying this area was incised.  Dissection was carried down to the level of the pronator quadratus which was subperiosteally stripped off the volar aspect distal radius revealing a complex 4-part intra-articular fracture of the distal radius.  We released brachioradialis off the radial styloid fragment to ease in reduction.  Reduction was performed with flexion, traction, and ulnar deviation.  We placed a standard DVR plate on the volar aspect of the distal radius and under fluoroscopic imaging determined that it was not wide enough to cover the fracture.  We therefore went ahead  and used the wide plate, this was fastened to the lower aspect of the distal radius through the slotted hole.  Once this was done, we used fluoroscopic imaging to determine adequate plate position.  We then placed 2 more cortical screws proximally followed by the smooth pegs distally under fluoroscopic and direct guidance.  Intraoperative fluoroscopy then revealed adequate reduction in AP lateral and obliques view as well as good position of the hardware in all 3 views.  The wound was irrigated. We loosely covered the plate with the pronator quadratus using 2-0 undyed Vicryl and 2-0 undyed Vicryl subcutaneously x3 sutures and a 3-0 Prolene subcuticular stitch on the skin.  Steri-Strips, 4x4s, fluffs, and a volar splint was applied.  The patient tolerated the procedure well, went to recovery room in stable fashion.     Sheral Apley Burney Gauze, M.D.   ______________________________ Sheral Apley. Burney Gauze, M.D.    MAW/MEDQ  D:  10/26/2015  T:  10/26/2015  Job:  [id number]

## 2015-10-26 NOTE — Transfer of Care (Signed)
Immediate Anesthesia Transfer of Care Note  Patient: Adam [name]  Procedure(s) Performed: Procedure(s): OPEN REDUCTION INTERNAL FIXATION (ORIF) DISTAL RADIAL FRACTURE (Right)  Patient Location: PACU  Anesthesia Type:General  Level of Consciousness: awake and patient cooperative  Airway & Oxygen Therapy: Patient Spontanous Breathing and Patient connected to face mask oxygen  Post-op Assessment: Report given to RN and Post -op Vital signs reviewed and stable  Post vital signs: Reviewed and stable  Last Vitals:  Vitals:   10/26/15 0729 10/26/15 0934  BP: (!) 147/92 (!) 157/95  Pulse: 80 71  Resp: 20 (P) 16  Temp: 36.8 C (P) 36.6 C    Last Pain:  Vitals:   10/26/15 0729  TempSrc: Oral  PainSc: 7       Patients Stated Pain Goal: 2 ([id number])  Complications: No apparent anesthesia complications

## 2015-10-26 NOTE — Anesthesia Procedure Notes (Signed)
Procedure Name: LMA Insertion Date/Time: 10/26/2015 8:26 AM Performed by: Kennetha Pearman D Pre-anesthesia Checklist: Patient identified, Emergency Drugs available, Suction available and Patient being monitored Patient Re-evaluated:Patient Re-evaluated prior to inductionOxygen Delivery Method: Circle system utilized Preoxygenation: Pre-oxygenation with 100% oxygen Intubation Type: IV induction Ventilation: Mask ventilation without difficulty LMA: LMA inserted LMA Size: 4.0 Number of attempts: 1 Airway Equipment and Method: Bite block Placement Confirmation: positive ETCO2 Tube secured with: Tape Dental Injury: Teeth and Oropharynx as per pre-operative assessment

## 2015-10-26 NOTE — Anesthesia Postprocedure Evaluation (Signed)
Anesthesia Post Note  Patient: Adam [name]  Procedure(s) Performed: Procedure(s) (LRB): OPEN REDUCTION INTERNAL FIXATION (ORIF) DISTAL RADIAL FRACTURE (Right)  Patient location during evaluation: PACU Anesthesia Type: General Level of consciousness: awake and alert and patient cooperative Pain management: pain level controlled Vital Signs Assessment: post-procedure vital signs reviewed and stable Respiratory status: spontaneous breathing and respiratory function stable Cardiovascular status: stable Anesthetic complications: no    Last Vitals:  Vitals:   10/26/15 1000 10/26/15 1015  BP: (!) 190/104 (!) 188/90  Pulse: 85 75  Resp: 14 (!) 9  Temp:      Last Pain:  Vitals:   10/26/15 1015  TempSrc:   PainSc: [place]

## 2015-10-26 NOTE — Discharge Instructions (Signed)
° °  Followup appointment with Dr. Burney Gauze in one week.

## 2015-10-26 NOTE — Op Note (Signed)
See note [phone]

## 2015-10-26 NOTE — Anesthesia Procedure Notes (Signed)
Procedure Name: LMA Insertion Date/Time: 10/26/2015 8:26 AM Performed by: Reesa Gotschall D Pre-anesthesia Checklist: Patient identified, Emergency Drugs available, Suction available and Patient being monitored Patient Re-evaluated:Patient Re-evaluated prior to inductionOxygen Delivery Method: Circle system utilized Preoxygenation: Pre-oxygenation with 100% oxygen Intubation Type: IV induction Ventilation: Mask ventilation without difficulty LMA: LMA inserted LMA Size: 4.0 Number of attempts: 1 Airway Equipment and Method: Bite block Placement Confirmation: positive ETCO2 Tube secured with: Tape Dental Injury: Teeth and Oropharynx as per pre-operative assessment

## 2015-10-26 NOTE — H&P (Signed)
Adam [name] is an 60 y.o. male.   Chief Complaint:right wrist pain and swelling HPI: as above s/p foosh to right side  Past Medical History:  Diagnosis Date  . Arthritis   . Chronic hepatitis C without hepatic coma ([place]) 08/16/2014   finished treatment 4-5 mos ago, CMET in [facility] 08-11-15  . H/O knee surgery 08/16/2014  . Hyperlipidemia 08/16/2014    Past Surgical History:  Procedure Laterality Date  . CHOLECYSTECTOMY N/A 08/12/2015   Procedure: LAPAROSCOPIC CHOLECYSTECTOMY WITH INTRAOPERATIVE CHOLANGIOGRAM;  Surgeon: Ralene Ok, MD;  Location: [facility];  Service: General;  Laterality: N/A;  . KNEE ARTHROSCOPY Bilateral     Family History  Problem Relation Age of Onset  . Heart disease Mother   . Hyperlipidemia Mother    Social History:  reports that he has never smoked. He has never used smokeless tobacco. He reports that he uses drugs, including Marijuana. He reports that he does not drink alcohol.  Allergies: No Known Allergies  Medications Prior to Admission  Medication Sig Dispense Refill  . atorvastatin (LIPITOR) 20 MG tablet   6  . cephALEXin (KEFLEX) 500 MG capsule Take 1 capsule (500 mg total) by mouth 4 (four) times daily. 28 capsule 0  . ibuprofen (ADVIL,MOTRIN) 200 MG tablet Take 400 mg by mouth every 6 (six) hours as needed for moderate pain.     Marland Kitchen oxyCODONE-acetaminophen (PERCOCET/ROXICET) 5-325 MG tablet Take 1-2 tablets by mouth every 6 (six) hours as needed for severe pain. 15 tablet 0    No results found for this or any previous visit (from the past 48 hour(s)). No results found.  Review of Systems  All other systems reviewed and are negative.   Blood pressure (!) 147/92, pulse 80, temperature 98.2 F (36.8 C), temperature source Oral, resp. rate 20, height 6\' 1"  (1.854 m), weight 77.6 kg (171 lb), SpO2 98 %. Physical Exam  Constitutional: He is oriented to person, place, and time. He appears well-developed and well-nourished.  HENT:  Head:  Normocephalic and atraumatic.  Neck: Normal range of motion.  Cardiovascular: Normal rate.   Respiratory: Effort normal.  Musculoskeletal:       Right wrist: He exhibits decreased range of motion, tenderness, bony tenderness, swelling and deformity.  Displaced right distal radius fracture  Neurological: He is alert and oriented to person, place, and time.  Skin: Skin is warm.  Psychiatric: He has a normal mood and affect. His behavior is normal. Judgment and thought content normal.     Assessment/Plan As above  Plan orif above  [name], MD 10/26/2015, 8:20 AM

## 2015-10-26 NOTE — Anesthesia Preprocedure Evaluation (Addendum)
Anesthesia Evaluation  Patient identified by MRN, date of birth, ID band Patient awake    Reviewed: Allergy & Precautions, H&P , NPO status , Patient's Chart, lab work & pertinent test results  Airway Mallampati: II   Neck ROM: full    Dental   Pulmonary neg pulmonary ROS,    breath sounds clear to auscultation       Cardiovascular negative cardio ROS   Rhythm:regular Rate:Normal     Neuro/Psych    GI/Hepatic (+) Hepatitis -, C  Endo/Other    Renal/GU      Musculoskeletal  (+) Arthritis ,   Abdominal   Peds  Hematology   Anesthesia Other Findings   Reproductive/Obstetrics                            Anesthesia Physical Anesthesia Plan  ASA: II  Anesthesia Plan: General   Post-op Pain Management:    Induction: Intravenous  Airway Management Planned: LMA  Additional Equipment:   Intra-op Plan:   Post-operative Plan:   Informed Consent: I have reviewed the patients History and Physical, chart, labs and discussed the procedure including the risks, benefits and alternatives for the proposed anesthesia with the patient or authorized representative who has indicated his/her understanding and acceptance.     Plan Discussed with: CRNA, Anesthesiologist and Surgeon  Anesthesia Plan Comments: (Pt declined nerve block.  His wife had a complication with a nerve block.)       Anesthesia Quick Evaluation

## 2015-10-28 MED FILL — OXYCODONE/APAP 5/325MG: 5-325 | 5 days supply | Qty: 30 | Fill #0

## 2015-10-31 ENCOUNTER — Encounter (HOSPITAL_BASED_OUTPATIENT_CLINIC_OR_DEPARTMENT_OTHER): Payer: Self-pay | Admitting: Orthopedic Surgery

## 2015-10-31 DIAGNOSIS — Z967 Presence of other bone and tendon implants: Secondary | ICD-10-CM | POA: Diagnosis not present

## 2015-10-31 DIAGNOSIS — S52571D Other intraarticular fracture of lower end of right radius, subsequent encounter for closed fracture with routine healing: Secondary | ICD-10-CM | POA: Diagnosis not present

## 2015-10-31 DIAGNOSIS — M25531 Pain in right wrist: Secondary | ICD-10-CM | POA: Diagnosis not present

## 2015-10-31 DIAGNOSIS — S52571A Other intraarticular fracture of lower end of right radius, initial encounter for closed fracture: Secondary | ICD-10-CM | POA: Diagnosis not present

## 2015-11-03 DIAGNOSIS — M25639 Stiffness of unspecified wrist, not elsewhere classified: Secondary | ICD-10-CM | POA: Diagnosis not present

## 2015-11-03 DIAGNOSIS — M25531 Pain in right wrist: Secondary | ICD-10-CM | POA: Diagnosis not present

## 2015-11-21 DIAGNOSIS — S52571A Other intraarticular fracture of lower end of right radius, initial encounter for closed fracture: Secondary | ICD-10-CM | POA: Diagnosis not present

## 2015-12-02 NOTE — Progress Notes (Signed)
Patient is being scheduled for preop appt. Please place surgical orders in epic. Thanks.

## 2015-12-13 DIAGNOSIS — S52571D Other intraarticular fracture of lower end of right radius, subsequent encounter for closed fracture with routine healing: Secondary | ICD-10-CM | POA: Diagnosis not present

## 2015-12-23 MED FILL — ATORVASTATIN 20 MG TABLET: 20 | 30 days supply | Qty: 30 | Fill #2

## 2016-01-02 DIAGNOSIS — R972 Elevated prostate specific antigen [PSA]: Secondary | ICD-10-CM | POA: Diagnosis not present

## 2016-01-10 DIAGNOSIS — S52571D Other intraarticular fracture of lower end of right radius, subsequent encounter for closed fracture with routine healing: Secondary | ICD-10-CM | POA: Diagnosis not present

## 2016-01-10 NOTE — Progress Notes (Signed)
Surgery on 01/27/16.  P[reop on 01/25/2016 at 0800am.  Need orders in EPIC.  Thank You

## 2016-01-19 ENCOUNTER — Other Ambulatory Visit: Payer: Self-pay | Admitting: Urology

## 2016-01-19 NOTE — Progress Notes (Signed)
Has pre op scheduled- please add SURGICAL ORDERS IN EPIC  thanks

## 2016-01-20 NOTE — Progress Notes (Signed)
Pre op appt 01/25/16--- PLEASE PUT SURGICAL ORDERS IN EPIC  THANKS

## 2016-01-24 NOTE — Patient Instructions (Signed)
Adam [name]  01/24/2016   Your procedure is scheduled on: 01/27/16  Report to [facility]  Entrance take [place]  elevators to 3rd floor to  [place] at  [place] AM.  Call this number if you have problems the morning of surgery [phone]   Remember: ONLY 1 PERSON MAY GO WITH YOU TO SHORT STAY TO GET  READY MORNING OF [place].  Do not TAKE ANYTHING ANYTHING BY MOUTH :After Midnight.   FOLLOW [facility] PREP INSTRUCTIONS AS PER OFFICE  Take these medicines the morning of surgery with A SIP OF WATER: NONE                                You may not have any metal on your body including hair pins and              piercings  Do not wear jewelry, make-up, lotions, powders or perfumes, deodorant             Do not wear nail polish.  Do not shave  48 hours prior to surgery.              Men may shave face and neck.   Do not bring valuables to the hospital. [place].  Contacts, dentures or bridgework may not be worn into surgery.  Leave suitcase in the car. After surgery it may be brought to your room.                 Please read over the following fact sheets you were given: _____________________________________________________________________             [facility] - Preparing for Surgery Before surgery, you can play an important role.  Because skin is not sterile, your skin needs to be as free of germs as possible.  You can reduce the number of germs on your skin by washing with CHG (chlorahexidine gluconate) soap before surgery.  CHG is an antiseptic cleaner which kills germs and bonds with the skin to continue killing germs even after washing. Please DO NOT use if you have an allergy to CHG or antibacterial soaps.  If your skin becomes reddened/irritated stop using the CHG and inform your nurse when you arrive at Short Stay. Do not shave (including legs and underarms) for at least 48 hours prior  to the first CHG shower.  You may shave your face/neck. Please follow these instructions carefully:  1.  Shower with CHG Soap the night before surgery and the  morning of Surgery.  2.  If you choose to wash your hair, wash your hair first as usual with your  normal  shampoo.  3.  After you shampoo, rinse your hair and body thoroughly to remove the  shampoo.                           4.  Use CHG as you would any other liquid soap.  You can apply chg directly  to the skin and wash                       Gently with a scrungie or clean washcloth.  5.  Apply the CHG Soap to your body ONLY FROM THE NECK DOWN.   Do not use on face/ open                           Wound or open sores. Avoid contact with eyes, ears mouth and genitals (private parts).                       Wash face,  Genitals (private parts) with your normal soap.             6.  Wash thoroughly, paying special attention to the area where your surgery  will be performed.  7.  Thoroughly rinse your body with warm water from the neck down.  8.  DO NOT shower/wash with your normal soap after using and rinsing off  the CHG Soap.                9.  Pat yourself dry with a clean towel.            10.  Wear clean pajamas.            11.  Place clean sheets on your bed the night of your first shower and do not  sleep with pets. Day of Surgery : Do not apply any lotions/deodorants the morning of surgery.  Please wear clean clothes to the hospital/surgery center.  FAILURE TO FOLLOW THESE INSTRUCTIONS MAY RESULT IN THE CANCELLATION OF YOUR SURGERY PATIENT SIGNATURE_________________________________  NURSE SIGNATURE__________________________________  ________________________________________________________________________    CLEAR LIQUID DIET   Foods Allowed                                                                     Foods Excluded  Coffee and tea, regular and decaf                             liquids that you cannot  Plain Jell-O  in any flavor                                             see through such as: Fruit ices (not with fruit pulp)                                     milk, soups, orange juice  Iced Popsicles                                    All solid food Carbonated beverages, regular and diet                                    Cranberry, grape and apple juices Sports drinks like Gatorade Lightly seasoned clear broth or consume(fat free) Sugar, honey syrup  Sample Menu Breakfast  Lunch                                     Supper Cranberry juice                    Beef broth                            Chicken broth Jell-O                                     Grape juice                           Apple juice Coffee or tea                        Jell-O                                      Popsicle                                                Coffee or tea                        Coffee or tea  _____________________________________________________________________

## 2016-01-25 ENCOUNTER — Encounter (HOSPITAL_COMMUNITY)
Admission: RE | Admit: 2016-01-25 | Discharge: 2016-01-25 | Disposition: A | Discharge status: Home / Self Care | Payer: 59 | Source: Ambulatory Visit | Attending: Urology | Admitting: Urology

## 2016-01-25 ENCOUNTER — Encounter (HOSPITAL_COMMUNITY): Payer: Self-pay

## 2016-01-25 DIAGNOSIS — C641 Malignant neoplasm of right kidney, except renal pelvis: Secondary | ICD-10-CM | POA: Diagnosis not present

## 2016-01-25 DIAGNOSIS — E785 Hyperlipidemia, unspecified: Secondary | ICD-10-CM | POA: Diagnosis not present

## 2016-01-25 DIAGNOSIS — B182 Chronic viral hepatitis C: Secondary | ICD-10-CM | POA: Diagnosis not present

## 2016-01-25 DIAGNOSIS — Z79899 Other long term (current) drug therapy: Secondary | ICD-10-CM | POA: Diagnosis not present

## 2016-01-25 HISTORY — DX: Other specified disorders of kidney and ureter: N28.89

## 2016-01-25 HISTORY — DX: Anxiety disorder, unspecified: F41.9

## 2016-01-25 LAB — COMPREHENSIVE METABOLIC PANEL
ALT: 15 U/L — AB (ref 17–63)
AST: 18 U/L (ref 15–41)
Albumin: 4.6 g/dL (ref 3.5–5.0)
Alkaline Phosphatase: 88 U/L (ref 38–126)
Anion gap: 8 (ref 5–15)
BUN: 16 mg/dL (ref 6–20)
CHLORIDE: 105 mmol/L (ref 101–111)
CO2: 27 mmol/L (ref 22–32)
CREATININE: 1.03 mg/dL (ref 0.61–1.24)
Calcium: 9.7 mg/dL (ref 8.9–10.3)
GFR calc Af Amer: >60 mL/min (ref >60)
GFR calc non Af Amer: >60 mL/min (ref >60)
Glucose, Bld: 141 mg/dL — ABNORMAL HIGH (ref 65–99)
Potassium: 4.3 mmol/L (ref 3.5–5.1)
SODIUM: 140 mmol/L (ref 135–145)
Total Bilirubin: 0.6 mg/dL (ref 0.3–1.2)
Total Protein: 8.5 g/dL — ABNORMAL HIGH (ref 6.5–8.1)

## 2016-01-25 LAB — ABO/RH: ABO/RH(D): B NEG

## 2016-01-25 LAB — CBC
HCT: 43.9 % (ref 39.0–52.0)
HEMOGLOBIN: 14.3 g/dL (ref 13.0–17.0)
MCH: 25.9 pg — ABNORMAL LOW (ref 26.0–34.0)
MCHC: 32.6 g/dL (ref 30.0–36.0)
MCV: 79.5 fL (ref 78.0–100.0)
PLATELETS: 240 10*3/uL (ref 150–400)
RBC: 5.52 MIL/uL (ref 4.22–5.81)
RDW: 14.8 % (ref 11.5–15.5)
WBC: 7.5 10*3/uL (ref 4.0–10.5)

## 2016-01-26 ENCOUNTER — Other Ambulatory Visit: Payer: Self-pay | Admitting: *Deleted

## 2016-01-26 NOTE — Patient Outreach (Addendum)
[facility]) Care Management  01/26/2016  Adam [name] [birth year] [id number]  Subjective: Telephone call to patient's home number, spoke with patient, and HIPAA verified.  Discussed [facility] Care Management UMR Preoperative calls, Transition of care follow up, and Link to Wellness program for [employer] and family members.  Patient voices understanding, is in agreement to complete preoperative call, and receive transition of care follow up.   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    Patient states he is having surgery tomorrow at [facility], is unsure of how long he will be in the hospital, assuming overnight, and is unaware of any need for home health services needs at this time.    Patient states his wife works for [employer].  He has family medical leave act paperwork in place through his employer.  He utilizes the [facility] for his medications.   RNCM educated patient on supplemental insurance (hospital indemnity), he voices understanding, states he will follow with wife to verify if they have supplemental insurance, and file claim if appropriate.  Patient states he does not have hypertension, has white coat syndrome,  and his blood pressure is also elevated in the MD's office initially and then returns to normal . Denies Link to Wellness needs at this time.  He will receive post hospital discharge transition of care follow up.     Objective:  Per chart review: Patient scheduled for surgery (XI ROBOTIC ASSITED PARTIAL NEPHRECTOMY)  on 01/27/16.    Patient hospitalized  08/11/15 -08/13/15 for Acute cholecystitis.   Status post LAPAROSCOPIC CHOLECYSTECTOMY WITH INTRAOPERATIVE CHOLANGIOGRAM on 08/12/15.   ED visit on 10/22/15 arm injury, status post fall.    Patient had Displaced intra-articular fracture, 4 part, right distal radius.    Status post Open reduction and internal fixation above using wide DVR plate and screws, on 10/26/15.   Patient also has a history of right renal mass,  Chronic hepatitis C, and hyperlipidemia.   Assessment: Received UMR Preoperative Call referral on 01/09/16 via [facility] Utilization Management Detail report.   Preoperative call completed, transition of care follow up, pending hospital discharge.  Plan: RNCM will call patient within 3 business days of hospital discharge.   Candies Palm H. Annia Friendly, BSN, [place] Management [facility] Telephonic CM Phone: [phone] Fax: [phone]

## 2016-01-26 NOTE — Anesthesia Preprocedure Evaluation (Addendum)
Anesthesia Evaluation  Patient identified by MRN, date of birth, ID band Patient awake    Reviewed: Allergy & Precautions, NPO status , Patient's Chart, lab work & pertinent test results  History of Anesthesia Complications Negative for: history of anesthetic complications  Airway Mallampati: I  TM Distance: >3 FB Neck ROM: Full    Dental no notable dental hx. (+) Dental Advisory Given, Edentulous Upper, Edentulous Lower   Pulmonary neg pulmonary ROS,    Pulmonary exam normal        Cardiovascular Normal cardiovascular exam     Neuro/Psych PSYCHIATRIC DISORDERS Anxiety negative neurological ROS     GI/Hepatic negative GI ROS, (+) Hepatitis -, C  Endo/Other  negative endocrine ROS  Renal/GU      Musculoskeletal   Abdominal   Peds  Hematology negative hematology ROS (+)   Anesthesia Other Findings   Reproductive/Obstetrics                           Anesthesia Physical Anesthesia Plan  ASA: II  Anesthesia Plan: Regional   Post-op Pain Management:    Induction: Intravenous  Airway Management Planned: Oral ETT  Additional Equipment:   Intra-op Plan:   Post-operative Plan: Extubation in OR  Informed Consent: I have reviewed the patients History and Physical, chart, labs and discussed the procedure including the risks, benefits and alternatives for the proposed anesthesia with the patient or authorized representative who has indicated his/her understanding and acceptance.   Dental advisory given  Plan Discussed with: CRNA and Anesthesiologist  Anesthesia Plan Comments:        Anesthesia Quick Evaluation

## 2016-01-27 ENCOUNTER — Encounter (HOSPITAL_COMMUNITY)
Admission: RE | Disposition: A | Discharge status: Home / Self Care | Payer: Self-pay | Source: Ambulatory Visit | Attending: Urology

## 2016-01-27 ENCOUNTER — Encounter (HOSPITAL_COMMUNITY): Payer: Self-pay | Admitting: *Deleted

## 2016-01-27 ENCOUNTER — Inpatient Hospital Stay (HOSPITAL_COMMUNITY): Payer: 59 | Admitting: Certified Registered Nurse Anesthetist

## 2016-01-27 ENCOUNTER — Inpatient Hospital Stay (HOSPITAL_COMMUNITY)
Admission: RE | Admit: 2016-01-27 | Discharge: 2016-01-28 | DRG: 658 | Disposition: A | Discharge status: Home / Self Care | Payer: 59 | Source: Ambulatory Visit | Attending: Urology | Admitting: Urology

## 2016-01-27 DIAGNOSIS — E785 Hyperlipidemia, unspecified: Secondary | ICD-10-CM | POA: Diagnosis present

## 2016-01-27 DIAGNOSIS — N2889 Other specified disorders of kidney and ureter: Secondary | ICD-10-CM | POA: Diagnosis not present

## 2016-01-27 DIAGNOSIS — C641 Malignant neoplasm of right kidney, except renal pelvis: Secondary | ICD-10-CM | POA: Diagnosis not present

## 2016-01-27 DIAGNOSIS — B182 Chronic viral hepatitis C: Secondary | ICD-10-CM | POA: Diagnosis present

## 2016-01-27 DIAGNOSIS — Z79899 Other long term (current) drug therapy: Secondary | ICD-10-CM | POA: Diagnosis not present

## 2016-01-27 HISTORY — PX: ROBOTIC ASSITED PARTIAL NEPHRECTOMY: SHX6087

## 2016-01-27 LAB — HEMOGLOBIN AND HEMATOCRIT, BLOOD
HCT: 38.8 % — ABNORMAL LOW (ref 39.0–52.0)
Hemoglobin: 13.2 g/dL (ref 13.0–17.0)

## 2016-01-27 LAB — TYPE AND SCREEN
ABO/RH(D): B NEG
ANTIBODY SCREEN: NEGATIVE

## 2016-01-27 SURGERY — NEPHRECTOMY, PARTIAL, ROBOT-ASSISTED
Anesthesia: Regional | Laterality: Right

## 2016-01-27 MED ORDER — KETAMINE HCL 10 MG/ML IJ SOLN
INTRAMUSCULAR | Status: AC
  Filled 2016-01-27: qty 1

## 2016-01-27 MED ORDER — CEFAZOLIN SODIUM-DEXTROSE 2-4 GM/100ML-% IV SOLN
INTRAVENOUS | Status: AC
  Filled 2016-01-27: qty 100

## 2016-01-27 MED ORDER — LACTATED RINGERS IR SOLN
Status: DC | PRN
  Administered 2016-01-27: 1000 mL

## 2016-01-27 MED ORDER — MANNITOL 25 % IV SOLN
INTRAVENOUS | Status: AC
  Filled 2016-01-27: qty 50

## 2016-01-27 MED ORDER — ONDANSETRON HCL 4 MG/2ML IJ SOLN
4.0000 mg | INTRAMUSCULAR | Status: DC | PRN
  Administered 2016-01-27 – 2016-01-28 (×3): 4 mg via INTRAVENOUS
  Filled 2016-01-27 (×3): qty 2

## 2016-01-27 MED ORDER — CEFAZOLIN SODIUM-DEXTROSE 2-4 GM/100ML-% IV SOLN
2.0000 g | INTRAVENOUS | Status: AC
  Administered 2016-01-27: 2 g via INTRAVENOUS
  Filled 2016-01-27: qty 100

## 2016-01-27 MED ORDER — ONDANSETRON HCL 4 MG/2ML IJ SOLN
INTRAMUSCULAR | Status: AC
  Filled 2016-01-27: qty 2

## 2016-01-27 MED ORDER — KETAMINE HCL 10 MG/ML IJ SOLN
INTRAMUSCULAR | Status: DC | PRN
  Administered 2016-01-27: 25 mg via INTRAVENOUS

## 2016-01-27 MED ORDER — SUGAMMADEX SODIUM 200 MG/2ML IV SOLN
INTRAVENOUS | Status: AC
  Filled 2016-01-27: qty 2

## 2016-01-27 MED ORDER — ROCURONIUM BROMIDE 50 MG/5ML IV SOSY
PREFILLED_SYRINGE | INTRAVENOUS | Status: AC
  Filled 2016-01-27: qty 5

## 2016-01-27 MED ORDER — EPHEDRINE SULFATE 50 MG/ML IJ SOLN
INTRAMUSCULAR | Status: DC | PRN
  Administered 2016-01-27 (×2): 10 mg via INTRAVENOUS

## 2016-01-27 MED ORDER — HYDROMORPHONE HCL 1 MG/ML IJ SOLN
INTRAMUSCULAR | Status: AC
  Filled 2016-01-27: qty 1

## 2016-01-27 MED ORDER — MIDAZOLAM HCL 2 MG/2ML IJ SOLN
INTRAMUSCULAR | Status: AC
  Filled 2016-01-27: qty 2

## 2016-01-27 MED ORDER — HYDROMORPHONE HCL 1 MG/ML IJ SOLN
0.2500 mg | INTRAMUSCULAR | Status: DC | PRN
  Administered 2016-01-27 (×4): 0.5 mg via INTRAVENOUS

## 2016-01-27 MED ORDER — SODIUM CHLORIDE 0.9 % IJ SOLN
INTRAMUSCULAR | Status: DC | PRN
  Administered 2016-01-27: 20 mL

## 2016-01-27 MED ORDER — FENTANYL CITRATE (PF) 250 MCG/5ML IJ SOLN
INTRAMUSCULAR | Status: AC
  Filled 2016-01-27: qty 5

## 2016-01-27 MED ORDER — PROPOFOL 10 MG/ML IV BOLUS
INTRAVENOUS | Status: AC
  Filled 2016-01-27: qty 20

## 2016-01-27 MED ORDER — SCOPOLAMINE 1 MG/3DAYS TD PT72
MEDICATED_PATCH | TRANSDERMAL | Status: AC
Start: 2016-01-27 — End: 2016-01-27
  Filled 2016-01-27: qty 1

## 2016-01-27 MED ORDER — DEXAMETHASONE SODIUM PHOSPHATE 10 MG/ML IJ SOLN
INTRAMUSCULAR | Status: DC | PRN
  Administered 2016-01-27: 10 mg via INTRAVENOUS

## 2016-01-27 MED ORDER — SUGAMMADEX SODIUM 200 MG/2ML IV SOLN
INTRAVENOUS | Status: DC | PRN
  Administered 2016-01-27: 400 mg via INTRAVENOUS

## 2016-01-27 MED ORDER — STERILE WATER FOR IRRIGATION IR SOLN
Status: DC | PRN
  Administered 2016-01-27: 1000 mL

## 2016-01-27 MED ORDER — DIPHENHYDRAMINE HCL 50 MG/ML IJ SOLN
12.5000 mg | Freq: Four times a day (QID) | INTRAMUSCULAR | Status: DC | PRN
Start: 2016-01-27 — End: 2016-01-28

## 2016-01-27 MED ORDER — DEXAMETHASONE SODIUM PHOSPHATE 10 MG/ML IJ SOLN
INTRAMUSCULAR | Status: AC
Start: 2016-01-27 — End: 2016-01-27
  Filled 2016-01-27: qty 1

## 2016-01-27 MED ORDER — SCOPOLAMINE 1 MG/3DAYS TD PT72
1.0000 | MEDICATED_PATCH | TRANSDERMAL | Status: DC
  Administered 2016-01-27: 1.5 mg via TRANSDERMAL
  Filled 2016-01-27: qty 1

## 2016-01-27 MED ORDER — BUPIVACAINE LIPOSOME 1.3 % IJ SUSP
INTRAMUSCULAR | Status: DC | PRN
  Administered 2016-01-27: 20 mL

## 2016-01-27 MED ORDER — ONDANSETRON HCL 4 MG/2ML IJ SOLN
INTRAMUSCULAR | Status: DC | PRN
  Administered 2016-01-27: 4 mg via INTRAVENOUS

## 2016-01-27 MED ORDER — MAGNESIUM CITRATE PO SOLN: 1.0000 | Freq: Once | ORAL | Status: DC

## 2016-01-27 MED ORDER — MANNITOL 25 % IV SOLN
INTRAVENOUS | Status: DC | PRN
  Administered 2016-01-27 (×2): 12.5 g via INTRAVENOUS

## 2016-01-27 MED ORDER — DEXTROSE-NACL 5-0.45 % IV SOLN
INTRAVENOUS | Status: DC
  Administered 2016-01-27 – 2016-01-28 (×3): via INTRAVENOUS

## 2016-01-27 MED ORDER — LACTATED RINGERS IV SOLN
INTRAVENOUS | Status: DC
  Administered 2016-01-27 (×2): via INTRAVENOUS

## 2016-01-27 MED ORDER — LIDOCAINE HCL (CARDIAC) 20 MG/ML IV SOLN
INTRAVENOUS | Status: DC | PRN
  Administered 2016-01-27: 100 mg via INTRAVENOUS

## 2016-01-27 MED ORDER — OXYCODONE-ACETAMINOPHEN 5-325 MG PO TABS: 1.0000 | ORAL_TABLET | Freq: Four times a day (QID) | ORAL | 0 refills | Status: DC | PRN

## 2016-01-27 MED ORDER — FENTANYL CITRATE (PF) 100 MCG/2ML IJ SOLN
INTRAMUSCULAR | Status: DC | PRN
  Administered 2016-01-27 (×7): 50 ug via INTRAVENOUS

## 2016-01-27 MED ORDER — PHENYLEPHRINE HCL 10 MG/ML IJ SOLN
INTRAMUSCULAR | Status: DC | PRN
  Administered 2016-01-27 (×4): 80 ug via INTRAVENOUS

## 2016-01-27 MED ORDER — EPHEDRINE 5 MG/ML INJ
INTRAVENOUS | Status: AC
  Filled 2016-01-27: qty 20

## 2016-01-27 MED ORDER — ROCURONIUM BROMIDE 100 MG/10ML IV SOLN
INTRAVENOUS | Status: DC | PRN
Start: 2016-01-27 — End: 2016-01-27
  Administered 2016-01-27 (×5): 10 mg via INTRAVENOUS
  Administered 2016-01-27: 50 mg via INTRAVENOUS
  Administered 2016-01-27: 20 mg via INTRAVENOUS
  Administered 2016-01-27: 10 mg via INTRAVENOUS

## 2016-01-27 MED ORDER — PROMETHAZINE HCL 25 MG/ML IJ SOLN: 6.2500 mg | INTRAMUSCULAR | Status: DC | PRN

## 2016-01-27 MED ORDER — BUPIVACAINE LIPOSOME 1.3 % IJ SUSP
INTRAMUSCULAR | Status: AC
  Filled 2016-01-27: qty 20

## 2016-01-27 MED ORDER — PROPOFOL 10 MG/ML IV BOLUS
INTRAVENOUS | Status: DC | PRN
  Administered 2016-01-27: 150 mg via INTRAVENOUS

## 2016-01-27 MED ORDER — PHENYLEPHRINE 40 MCG/ML (10ML) SYRINGE FOR IV PUSH (FOR BLOOD PRESSURE SUPPORT)
PREFILLED_SYRINGE | INTRAVENOUS | Status: AC
  Filled 2016-01-27: qty 10

## 2016-01-27 MED ORDER — HYDROMORPHONE HCL 1 MG/ML IJ SOLN
0.5000 mg | INTRAMUSCULAR | Status: DC | PRN
  Administered 2016-01-27 – 2016-01-28 (×5): 1 mg via INTRAVENOUS
  Filled 2016-01-27 (×5): qty 1

## 2016-01-27 MED ORDER — FENTANYL CITRATE (PF) 100 MCG/2ML IJ SOLN
INTRAMUSCULAR | Status: AC
  Filled 2016-01-27: qty 2

## 2016-01-27 MED ORDER — OXYCODONE HCL 5 MG PO TABS
5.0000 mg | ORAL_TABLET | ORAL | Status: DC | PRN
  Administered 2016-01-28: 5 mg via ORAL
  Filled 2016-01-27: qty 1

## 2016-01-27 MED ORDER — ACETAMINOPHEN 325 MG PO TABS: 650.0000 mg | ORAL_TABLET | ORAL | Status: DC | PRN

## 2016-01-27 MED ORDER — LIDOCAINE 2% (20 MG/ML) 5 ML SYRINGE
INTRAMUSCULAR | Status: AC
  Filled 2016-01-27: qty 5

## 2016-01-27 MED ORDER — SODIUM CHLORIDE 0.9 % IJ SOLN
INTRAMUSCULAR | Status: AC
Start: 2016-01-27 — End: 2016-01-27
  Filled 2016-01-27: qty 50

## 2016-01-27 MED ORDER — MIDAZOLAM HCL 5 MG/5ML IJ SOLN
INTRAMUSCULAR | Status: DC | PRN
Start: 2016-01-27 — End: 2016-01-27
  Administered 2016-01-27: 2 mg via INTRAVENOUS

## 2016-01-27 MED ORDER — DIPHENHYDRAMINE HCL 12.5 MG/5ML PO ELIX: 12.5000 mg | ORAL_SOLUTION | Freq: Four times a day (QID) | ORAL | Status: DC | PRN

## 2016-01-27 SURGICAL SUPPLY — 65 items
ADH SKN CLS APL DERMABOND .7 (GAUZE/BANDAGES/DRESSINGS) ×1
APL ESCP 34 STRL LF DISP (HEMOSTASIS) ×1
APPLICATOR SURGIFLO ENDO (HEMOSTASIS) ×2 IMPLANT
BAG SPEC RTRVL LRG 6X4 10 (ENDOMECHANICALS) ×1
CHLORAPREP W/TINT 26ML (MISCELLANEOUS) ×2 IMPLANT
CLIP LIGATING HEM O LOK PURPLE (MISCELLANEOUS) ×3 IMPLANT
CLIP LIGATING HEMO O LOK GREEN (MISCELLANEOUS) ×4 IMPLANT
CLIP SUT LAPRA TY ABSORB (SUTURE) ×6 IMPLANT
COVER TIP SHEARS 8 DVNC (MISCELLANEOUS) ×1 IMPLANT
COVER TIP SHEARS 8MM DA VINCI (MISCELLANEOUS) ×1
DECANTER SPIKE VIAL GLASS SM (MISCELLANEOUS) ×2 IMPLANT
DERMABOND ADVANCED (GAUZE/BANDAGES/DRESSINGS) ×1
DERMABOND ADVANCED .7 DNX12 (GAUZE/BANDAGES/DRESSINGS) ×1 IMPLANT
DRAIN CHANNEL 15F RND FF 3/16 (WOUND CARE) ×2 IMPLANT
DRAPE ARM DVNC X/XI (DISPOSABLE) ×4 IMPLANT
DRAPE COLUMN DVNC XI (DISPOSABLE) ×1 IMPLANT
DRAPE DA VINCI XI ARM (DISPOSABLE) ×4
DRAPE DA VINCI XI COLUMN (DISPOSABLE) ×1
DRAPE INCISE IOBAN 66X45 STRL (DRAPES) ×2 IMPLANT
DRAPE SHEET LG 3/4 BI-LAMINATE (DRAPES) ×2 IMPLANT
DRSG TEGADERM 4X4.75 (GAUZE/BANDAGES/DRESSINGS) ×1 IMPLANT
ELECT PENCIL ROCKER SW 15FT (MISCELLANEOUS) ×2 IMPLANT
ELECT REM PT RETURN 9FT ADLT (ELECTROSURGICAL) ×2
ELECTRODE REM PT RTRN 9FT ADLT (ELECTROSURGICAL) ×1 IMPLANT
EVACUATOR SILICONE 100CC (DRAIN) ×2 IMPLANT
GLOVE BIO SURGEON STRL SZ 6.5 (GLOVE) ×2 IMPLANT
GLOVE BIOGEL M 8.0 STRL (GLOVE) ×4 IMPLANT
GOWN STRL REUS W/TWL LRG LVL3 (GOWN DISPOSABLE) ×4 IMPLANT
HEMOSTAT SURGICEL 4X8 (HEMOSTASIS) ×2 IMPLANT
IRRIG SUCT STRYKERFLOW 2 WTIP (MISCELLANEOUS)
IRRIGATION SUCT STRKRFLW 2 WTP (MISCELLANEOUS) IMPLANT
KIT BASIN OR (CUSTOM PROCEDURE TRAY) ×2 IMPLANT
LOOP VESSEL MAXI BLUE (MISCELLANEOUS) ×1 IMPLANT
MARKER SKIN DUAL TIP RULER LAB (MISCELLANEOUS) ×2 IMPLANT
NDL INSUFFLATION 14GA 120MM (NEEDLE) ×1 IMPLANT
NEEDLE INSUFFLATION 14GA 120MM (NEEDLE) ×2 IMPLANT
NS IRRIG 1000ML POUR BTL (IV SOLUTION) ×1 IMPLANT
POSITIONER SURGICAL ARM (MISCELLANEOUS) ×4 IMPLANT
POUCH SPECIMEN RETRIEVAL 10MM (ENDOMECHANICALS) ×2 IMPLANT
RELOAD STAPLE 60 2.6 WHT THN (STAPLE) IMPLANT
RELOAD STAPLER WHITE 60MM (STAPLE) IMPLANT
SEAL CANN UNIV 5-8 DVNC XI (MISCELLANEOUS) ×4 IMPLANT
SEAL XI 5MM-8MM UNIVERSAL (MISCELLANEOUS) ×4
SOLUTION ELECTROLUBE (MISCELLANEOUS) ×2 IMPLANT
SPONGE LAP 4X18 X RAY DECT (DISPOSABLE) ×2 IMPLANT
STAPLE ECHEON FLEX 60 POW ENDO (STAPLE) IMPLANT
STAPLER RELOAD WHITE 60MM (STAPLE)
SURGIFLO W/THROMBIN 8M KIT (HEMOSTASIS) ×2 IMPLANT
SUT ETHILON 3 0 PS 1 (SUTURE) ×2 IMPLANT
SUT MNCRL AB 4-0 PS2 18 (SUTURE) ×4 IMPLANT
SUT VIC AB 0 CT1 27 (SUTURE) ×8
SUT VIC AB 0 CT1 27XBRD ANTBC (SUTURE) ×4 IMPLANT
SUT VIC AB 2-0 SH 27 (SUTURE) ×10
SUT VIC AB 2-0 SH 27X BRD (SUTURE) ×5 IMPLANT
SUT VLOC BARB 180 ABS3/0GR12 (SUTURE) ×4
SUTURE VLOC BRB 180 ABS3/0GR12 (SUTURE) ×2 IMPLANT
TAPE STRIPS DRAPE STRL (GAUZE/BANDAGES/DRESSINGS) ×1 IMPLANT
TOWEL OR 17X26 10 PK STRL BLUE (TOWEL DISPOSABLE) ×1 IMPLANT
TOWEL OR NON WOVEN STRL DISP B (DISPOSABLE) ×2 IMPLANT
TRAY FOLEY W/METER SILVER 16FR (SET/KITS/TRAYS/PACK) ×2 IMPLANT
TRAY LAPAROSCOPIC (CUSTOM PROCEDURE TRAY) ×2 IMPLANT
TROCAR BLADELESS OPT 5 100 (ENDOMECHANICALS) ×1 IMPLANT
TROCAR UNIVERSAL OPT 12M 100M (ENDOMECHANICALS) ×2 IMPLANT
TROCAR XCEL 12X100 BLDLESS (ENDOMECHANICALS) ×2 IMPLANT
WATER STERILE IRR 1500ML POUR (IV SOLUTION) ×3 IMPLANT

## 2016-01-27 NOTE — H&P (Signed)
Urology Admission H&P  Chief Complaint: right renal mass  History of Present Illness: Adam [name] is a 60yo wit a 2cm right renal mass concerning for malignancy. He has a hx of a lap cholecystectomy.  Past Medical History:  Diagnosis Date  . Anxiety    with some things  . Arthritis   . Chronic hepatitis C without hepatic coma ([place]) 08/16/2014   finished treatment 4-5 mos ago, CMET in [facility] 08-11-15  . H/O knee surgery 08/16/2014  . Hyperlipidemia 08/16/2014  . Right renal mass    Past Surgical History:  Procedure Laterality Date  . CHOLECYSTECTOMY N/A 08/12/2015   Procedure: LAPAROSCOPIC CHOLECYSTECTOMY WITH INTRAOPERATIVE CHOLANGIOGRAM;  Surgeon: Ralene Ok, MD;  Location: [facility];  Service: General;  Laterality: N/A;  . KNEE ARTHROSCOPY Bilateral   . OPEN REDUCTION INTERNAL FIXATION (ORIF) DISTAL RADIAL FRACTURE Right 10/26/2015   Procedure: OPEN REDUCTION INTERNAL FIXATION (ORIF) DISTAL RADIAL FRACTURE;  Surgeon: Charlotte Crumb, MD;  Location: [facility];  Service: Orthopedics;  Laterality: Right;    Home Medications:  Prescriptions Prior to Admission  Medication Sig Dispense Refill Last Dose  . atorvastatin (LIPITOR) 20 MG tablet Take 20 mg by mouth daily.   6 01/26/2016 at Unknown time  . diphenhydramine-acetaminophen (TYLENOL PM) 25-500 MG TABS tablet Take 2 tablets by mouth at bedtime as needed (sleep).   01/24/2016  . ibuprofen (ADVIL,MOTRIN) 200 MG tablet Take 600 mg by mouth every 6 (six) hours as needed for moderate pain.    Past Week at Unknown time  . oxyCODONE-acetaminophen (PERCOCET/ROXICET) 5-325 MG tablet Take 1-2 tablets by mouth every 6 (six) hours as needed for severe pain. (Patient not taking: Reported on 01/20/2016) 15 tablet 0 Completed Course at Unknown time  . oxyCODONE-acetaminophen (ROXICET) 5-325 MG tablet Take 1 tablet by mouth every 4 (four) hours as needed for severe pain. (Patient not taking: Reported on 01/20/2016) 30 tablet 0 Completed  Course at Unknown time   Allergies: No Known Allergies  Family History  Problem Relation Age of Onset  . Heart disease Mother   . Hyperlipidemia Mother    Social History:  reports that he has never smoked. He has never used smokeless tobacco. He reports that he uses drugs, including Marijuana. He reports that he does not drink alcohol.  Review of Systems  All other systems reviewed and are negative.   Physical Exam:  Vital signs in last 24 hours: Temp:  [98 F (36.7 C)] 98 F (36.7 C) (10/27 0538) Pulse Rate:  [76] 76 (10/27 0538) Resp:  [18] 18 (10/27 0538) BP: (165)/(90) 165/90 (10/27 0538) SpO2:  [97 %] 97 % (10/27 0538) Weight:  [77.1 kg (170 lb)] 77.1 kg (170 lb) (10/27 0558) Physical Exam  Constitutional: He is oriented to person, place, and time. He appears well-developed and well-nourished.  HENT:  Head: Normocephalic and atraumatic.  Eyes: EOM are normal. Pupils are equal, round, and reactive to light.  Neck: Normal range of motion. No thyromegaly present.  Cardiovascular: Normal rate and regular rhythm.   Respiratory: Effort normal. No respiratory distress.  GI: Soft. He exhibits no distension.  Musculoskeletal: Normal range of motion. He exhibits no edema.  Neurological: He is alert and oriented to person, place, and time.  Skin: Skin is warm and dry.  Psychiatric: He has a normal mood and affect. His behavior is normal. Judgment and thought content normal.    Laboratory Data:  No results found for this or any previous visit (from the past  24 hour(s)). No results found for this or any previous visit (from the past 240 hour(s)). Creatinine:  Recent Labs  01/25/16 0850  CREATININE 1.03   Baseline Creatinine: 1  Impression/Assessment:  60yo with right lower pole renal mass, concerning for malignancy  Plan:  The risk/benefits/alternatives to right robotic assisted laparoscopic partial nephrectomy was explained to the patient and he understand and wishes  to proceed with surgery  Nicolette Bang 01/27/2016, 7:34 AM

## 2016-01-27 NOTE — Op Note (Signed)
Preoperative diagnosis: Right renal mass  Postop diagnosis: Same  Procedure: 1.  Right robot assisted laparoscopic radical partial nephrectomy 2. Intra-operative ultrasound 3. Lysis of adhesions, moderate  Attending: Nicolette Bang  Assistant: Debbrah Alar  Anesthesia: General  Estimated blood loss: 200 cc  Drains: 16 French Foley catheter, JP drain  Specimens: Right renal mass with overlying renal fat  Antibiotics: ancef  Findings: 1 artery and 1 vein. 3cm right posterior lower pole renal mass Warm Ischemia time: 28 minutes  Indications: Patient is a 60 year old with a history of 3 cm right renal mass.  The mass was amenable to partial nephrectomy.  After discussing treatment options patient decided to proceed with right robot assisted laparoscopic partial nephrectomy.  Procedure in detail: Prior to procedure consent was obtained. Patient was brought to the operating room and briefing was done sure correct patient, correct procedure, correct site.  General anesthesia was in administered patient was placed in the left lateral decubitus position.  a 33 French catheter was placed. their abdomen and flank was then prepped and draped usual sterile fashion.  A Veress needle was used to obtain pneumoperitoneum.  Once pneumoperitoneum was reestablished to 15 mmHg we then placed a 8 mm camera port lateral to the umbilicus at the lateral edge of rectus.  We then proceeded to place 3 more robotic ports. We then placed an assistant port. We then docked the robot. We encountered several anterior abdominal wall adhesions as well of omental and liver adhesions from his previous cholecystectomy. The adhesions took an addition 30 minutes to remove. We then started this dissection along the white line of [place].  We then reflected the colon medially.  We then kocherized the duodenum. We then identified the psoas muscle.  Once this was done we traced it down to the iliac vessels and identified the ureter.   Once we identified the gonadal vein and ureter were then traced this to the renal hilum.  The renal vein and renal artery were skeletonized.  We did we identified one renal vein one renal artery. We placed a vessel loop around the renal artery.  We then turned our attention to locating the renal mass. This required mobilizing the kidney medially. We proceeded to remove the overlying renal fat until we identified the renal mass. The mass was predominantly endophytic so we used the ultrasound to assess the size and margins of the mass. Once this was done with the circumscribed the mass with electrocautery. We then placed a bulldog clamp on the renal artery and this began our warm ischemia time. Using sharp dissection the mass was removed. Using a 2-0 V lock suture we oversewed the tumor bed. We then used 0 Vicryl with hem-o-locks in an interrupted fashion to reapproximate the defect int he kidney. Once this was done we removed the bulldog clamp and noted no residual bleeding.  We then placed the specimen in an Endo Catch bag.  Once the specimen was in the Endo Catch bag we then inspected the left kidney and noted no residual bleeding. We then placed a JP drain in the lower quadrant robot port. THis was secured with a 0 nylon.  We then removed our instruments, undocked the robot, and released the pneumoperitoneum. Once the specimen was removed we then closed the camera and assistant ports with 0 Vicryl in interrupted fashion.  The skin was then subcuticularly closed with 4-0 Monocryl.  We then placed Dermabond over all the incisions.  This included the procedure which resulted by  the patient.  The assistant was utilized for retraction which was vital since the mass was posterior and lower to mid pole. She was also need to pass the suture for the renorrhaphy.   Complications: None  Condition: Stable, x-rayed, transferred to PACU.  Plan: Patient is to be admitted for inpatient stay. The foley catheter will be  removed in the morning. They will be started on a clear liquid diet POD#1

## 2016-01-27 NOTE — Discharge Instructions (Signed)

## 2016-01-27 NOTE — Anesthesia Procedure Notes (Signed)
Procedure Name: Intubation Date/Time: 01/27/2016 7:51 AM Performed by: West Pugh Pre-anesthesia Checklist: Patient identified, Emergency Drugs available, Suction available, Patient being monitored and Timeout performed Patient Re-evaluated:Patient Re-evaluated prior to inductionOxygen Delivery Method: Circle system utilized Preoxygenation: Pre-oxygenation with 100% oxygen Intubation Type: IV induction Ventilation: Mask ventilation without difficulty Laryngoscope Size: Mac and 4 Grade View: Grade I Tube type: Oral Tube size: 7.5 mm Number of attempts: 1 Airway Equipment and Method: Stylet Placement Confirmation: ETT inserted through vocal cords under direct vision,  positive ETCO2,  CO2 detector and breath sounds checked- equal and bilateral Secured at: 22 cm Tube secured with: Tape Dental Injury: Teeth and Oropharynx as per pre-operative assessment

## 2016-01-27 NOTE — Transfer of Care (Signed)
Immediate Anesthesia Transfer of Care Note  Patient: Adam [name]  Procedure(s) Performed: Procedure(s): XI ROBOTIC ASSITED PARTIAL NEPHRECTOMY (Right)  Patient Location: PACU  Anesthesia Type:General  Level of Consciousness:  sedated, patient cooperative and responds to stimulation  Airway & Oxygen Therapy:Patient Spontanous Breathing and Patient connected to face mask oxgen  Post-op Assessment:  Report given to PACU RN and Post -op Vital signs reviewed and stable  Post vital signs:  Reviewed and stable  Last Vitals:  Vitals:   01/27/16 0538 01/27/16 1115  BP: (!) 165/90 (!) 172/94  Pulse: 76 97  Resp: 18 13  Temp: [id number] C     Complications: No apparent anesthesia complications

## 2016-01-27 NOTE — Anesthesia Postprocedure Evaluation (Signed)
Anesthesia Post Note  Patient: Adam [name]  Procedure(s) Performed: Procedure(s) (LRB): XI ROBOTIC ASSITED PARTIAL NEPHRECTOMY (Right)  Patient location during evaluation: PACU Anesthesia Type: General Level of consciousness: sedated Pain management: pain level controlled Vital Signs Assessment: post-procedure vital signs reviewed and stable Respiratory status: spontaneous breathing and respiratory function stable Cardiovascular status: stable Anesthetic complications: no    Last Vitals:  Vitals:   01/27/16 1219 01/27/16 1242  BP: (!) 170/86 (!) 141/87  Pulse:  82  Resp: 14   Temp: 36.7 C     Last Pain:  Vitals:   01/27/16 1200  TempSrc:   PainSc: [place]

## 2016-01-28 LAB — BASIC METABOLIC PANEL
Anion gap: 8 (ref 5–15)
BUN: 11 mg/dL (ref 6–20)
CO2: 27 mmol/L (ref 22–32)
CREATININE: 1.06 mg/dL (ref 0.61–1.24)
Calcium: 8.8 mg/dL — ABNORMAL LOW (ref 8.9–10.3)
Chloride: 104 mmol/L (ref 101–111)
Glucose, Bld: 150 mg/dL — ABNORMAL HIGH (ref 65–99)
Potassium: 3.4 mmol/L — ABNORMAL LOW (ref 3.5–5.1)
SODIUM: 139 mmol/L (ref 135–145)

## 2016-01-28 LAB — CREATININE, FLUID (PLEURAL, PERITONEAL, JP DRAINAGE): Creat, Fluid: 1 mg/dL

## 2016-01-28 LAB — HEMOGLOBIN AND HEMATOCRIT, BLOOD
HEMATOCRIT: 39.1 % (ref 39.0–52.0)
HEMOGLOBIN: 12.7 g/dL — AB (ref 13.0–17.0)

## 2016-01-28 MED ORDER — SENNOSIDES-DOCUSATE SODIUM 8.6-50 MG PO TABS: 1.0000 | ORAL_TABLET | Freq: Two times a day (BID) | ORAL | 0 refills | Status: DC

## 2016-01-28 NOTE — Progress Notes (Signed)
Patient to be discharged home as ordered. Discharge paperwork explained to patient who verbalized understanding. Printed prescriptions given to patient. FC and JP Drain removed by MD with no complications. Pt able to void several times. Also able to ambulate 360 feet in hallway with standby assist- tolerated fair. Pain under control. Able to eat small amounts of meal while tolerating well. Will D/C home with wife- will assist to exit by W/C.

## 2016-01-28 NOTE — Progress Notes (Signed)
1 Day Post-Op  Subjective:  1 - RIGHT Renal Mass - s/p RIGHT robotic partial nephrectomy on 01/27/2016. Path pending.   Today " Adam [name]" is stable. Hgb and Cr acceptable. JP output minimal. Pain controlled. No emesis.  Objective: Vital signs in last 24 hours: Temp:  [97.9 F (36.6 C)-99.1 F (37.3 C)] 98.9 F (37.2 C) (10/28 0648) Pulse Rate:  [72-97] 76 (10/28 0648) Resp:  [10-21] 16 (10/28 0648) BP: (138-172)/(86-98) 138/87 (10/28 0648) SpO2:  [95 %-100 %] 98 % (10/28 0648) Weight:  [84.5 kg (186 lb 4.6 oz)] 84.5 kg (186 lb 4.6 oz) (10/27 1227) Last BM Date: 01/26/16  Intake/Output from previous day: 10/27 0701 - 10/28 0700 In: 3736.7 [P.O.:480; I.V.:3256.7] Out: 1920 [id number]; Drains:195; Blood:150] Intake/Output this shift: No intake/output data recorded.  General appearance: alert, cooperative, appears stated age and family at bedside Eyes: negative Nose: Nares normal. Septum midline. Mucosa normal. No drainage or sinus tenderness. Throat: lips, mucosa, and tongue normal; teeth and gums normal Neck: supple, symmetrical, trachea midline Back: symmetric, no curvature. ROM normal. No CVA tenderness. Resp: non-labored on minimal [place] O2 Cardio: Nl rate GI: soft, non-tender; bowel sounds normal; no masses,  no organomegaly Male genitalia: normal, foley with yellow urine, removed.  Extremities: extremities normal, atraumatic, no cyanosis or edema Pulses: 2+ and symmetric Skin: Skin color, texture, turgor normal. No rashes or lesions Neurologic: Grossly normal Incision/Wound: JP with scant serosanguinous fluid. Port sites c/d/i, no hernias / hematomas.   Lab Results:   Recent Labs  01/25/16 0850 01/27/16 1145 01/28/16 0512  WBC 7.5  --   --   HGB 14.3 13.2 12.7*  HCT 43.9 38.8* 39.1  PLT 240  --   --    BMET  Recent Labs  01/25/16 0850 01/28/16 0512  NA 140 139  K 4.3 3.4*  CL 105 104  CO2 27 27  GLUCOSE 141* 150*  BUN 16 11  CREATININE 1.03 1.06   CALCIUM 9.7 8.8*   PT/INR No results for input(s): LABPROT, INR in the last 72 hours. ABG No results for input(s): PHART, HCO3 in the last 72 hours.  Invalid input(s): PCO2, PO2  Studies/Results: No results found.  Anti-infectives: Anti-infectives    Start     Dose/Rate Route Frequency Ordered Stop   01/27/16 0534  ceFAZolin (ANCEF) IVPB 2g/100 mL premix     2 g 200 mL/hr over 30 Minutes Intravenous 30 min pre-op 01/27/16 0534 01/27/16 0815      Assessment/Plan:  1 - RIGHT Renal Mass - doing well POD 1. DC foley, Check JP Cr, Reg Diet, Saline Lock, Ambulate. Goals for discharged discussed, likely later today v. Tomorrow.   [facility], Coletta Lockner 01/28/2016

## 2016-01-28 NOTE — Discharge Summary (Signed)
Physician Discharge Summary  Patient ID: Adam [name] MRN: [id number] DOB/AGE: 61/23/1957 61 y.o.  Admit date: 01/27/2016 Discharge date: 01/28/2016  Admission Diagnoses: Right Renal mass  Discharge Diagnoses:  Active Problems:   Renal mass   Discharged Condition: good  Hospital Course:   1 - RIGHT Renal Mass - s/p RIGHT robotic partial nephrectomy on 01/27/2016, the day of admission, without acute complications. By the afternoon of POD 1 he is ambualtory, tollerating regular diet, pain controlled with PO meds and felt to be adequate for discharge. JP Cr same as serum therefore removed. Voided spontantously x several after foley removal. Hgb 12.7. Final mass path pending.     Consults: None  Significant Diagnostic Studies: labs: as per above  Treatments: surgery: RIGHT robotic partial nephrectomy on 01/27/2016  Discharge Exam: Blood pressure (!) 152/91, pulse 77, temperature 99.6 F (37.6 C), temperature source Oral, resp. rate 16, height 6\' 1"  (1.854 m), weight 84.5 kg (186 lb 4.6 oz), SpO2 97 %. please see exam from progress noted dated same date 01/28/16. JP removed and dry dressing applied.   Disposition: 01-Home or Self Care     Medication List    STOP taking these medications   ibuprofen 200 MG tablet Commonly known as:  ADVIL,MOTRIN     TAKE these medications   atorvastatin 20 MG tablet Commonly known as:  LIPITOR Take 20 mg by mouth daily.   diphenhydramine-acetaminophen 25-500 MG Tabs tablet Commonly known as:  TYLENOL PM Take 2 tablets by mouth at bedtime as needed (sleep).   oxyCODONE-acetaminophen 5-325 MG tablet Commonly known as:  PERCOCET/ROXICET Take 1-2 tablets by mouth every 6 (six) hours as needed for moderate pain or severe pain. What changed:  reasons to take this  Another medication with the same name was removed. Continue taking this medication, and follow the directions you see here.   senna-docusate 8.6-50 MG tablet Commonly  known as:  Senokot-S Take 1 tablet by mouth 2 (two) times daily. While taking pain meds to prevent constipation.      Follow-up Information    WARDEN,DIANE, NP Follow up on 02/10/2016.   Specialty:  Urology Why:  at 10:00 Contact information: [address] [place] [address] [phone]           Signed: Alexis Frock 01/28/2016, 2:33 PM

## 2016-01-30 ENCOUNTER — Other Ambulatory Visit: Payer: Self-pay | Admitting: *Deleted

## 2016-01-30 NOTE — Patient Outreach (Addendum)
[facility]) Care Management  01/30/2016  Adam [name] February 26, 1956 [id number]   Subjective: Telephone call to patient's home number, spoke with patient's wife, states patient is currently unavailable, left HIPAA compliant message with wife for patient, and requested call back.  Objective:  Per chart review: Patient hospital  01/27/16 -01/28/16 for Renal Mass.  Status post  Right robot assisted laparoscopic radical partial nephrectomy , Intra-operative ultrasound, and Lysis of adhesions, moderate on 01/27/16.    Patient hospitalized  08/11/15 -08/13/15 for Acute cholecystitis.   Status post LAPAROSCOPIC CHOLECYSTECTOMY WITH INTRAOPERATIVE CHOLANGIOGRAM on 08/12/15.   ED visit on 10/22/15 arm injury, status post fall.    Patient had Displaced intra-articular fracture, 4 part, right distal radius.    Status post Open reduction and internal fixation above using wide DVR plate and screws, on 10/26/15. Patient also has a history of right renal mass,  Chronic hepatitis C, and hyperlipidemia.   Assessment: Received UMR Preoperative Call referral on 01/09/16 via [facility] Utilization Management Detail report.   Preoperative call completed.   Transition of care follow up, pending patient contact.  Plan: RNCM will call patient within 10 business days for 2nd telephone outreach attempt, transition of care follow up, if no return call.    Iver Miklas H. Annia Friendly, BSN, [place] Management [facility] Telephonic CM Phone: [phone] Fax: [phone]

## 2016-01-31 ENCOUNTER — Other Ambulatory Visit: Payer: Self-pay | Admitting: *Deleted

## 2016-01-31 ENCOUNTER — Ambulatory Visit: Payer: Self-pay | Admitting: *Deleted

## 2016-01-31 NOTE — Patient Outreach (Addendum)
[facility]) Care Management  01/31/2016  Adam [name] 02/08/1956 [id number]  Subjective: Telephone call to patient's home number, spoke with patient, states he remembers speaking with this RNCM prior to his surgery, very appreciative of call, and requested call back tomorrow.   States is currently in the middle of something.    Objective:Per chart review: Patient hospital  01/27/16 -01/28/16 for Renal Mass.  Status post  Right robot assisted laparoscopic radical partial nephrectomy , Intra-operative ultrasound, and Lysis of adhesions, moderate on 01/27/16.   Patient hospitalized 08/11/15 -08/13/15 for Acute cholecystitis. Status post LAPAROSCOPIC CHOLECYSTECTOMY WITH INTRAOPERATIVE CHOLANGIOGRAM on 08/12/15. ED visit on 10/22/15 arm injury, status post fall. Patient had Displaced intra-articular fracture, 4 part, right distal radius. Status post Open reduction and internal fixation above using wide DVR plate and screws, on 10/26/15. Patient also has a history of right renal mass, Chronic hepatitis C, and hyperlipidemia.   Assessment: Received UMR Preoperative Call referral on 01/09/16 via [facility] Utilization Management Detail report. Preoperative call completed.   Transition of care follow up, pending patient contact.  Plan: RNCM will call patient within 10 business days for 3rd telephone outreach attempt, transition of care follow up, if no return call.    Vinny Taranto H. Annia Friendly, BSN, [place] Management [facility] Telephonic CM Phone: [phone] Fax: [phone]

## 2016-02-01 ENCOUNTER — Other Ambulatory Visit: Payer: Self-pay | Admitting: *Deleted

## 2016-02-01 ENCOUNTER — Encounter: Payer: Self-pay | Admitting: *Deleted

## 2016-02-01 NOTE — Patient Outreach (Addendum)
[facility]) Care Management  02/01/2016  Adam [name] [birth year] [id number]  Subjective: Telephone call to patient's home number, spoke with patient, and HIPAA verified.   Patient also gave verbal authorization to speak with wife Earlie Server) regarding his healthcare needs as needed.  Discussed [facility] Care Management UMR Transition of care follow up and is in agreement to complete.  Patient states the surgery went well, things are going good, and pain medications are managing pain.    States the glue that was used at the surgical site is alittle irritating and he is waiting  for it to dissolve.   States he is able to self manage the irritation.  Patient states he has a follow up appointment with surgeon on 02/10/16 and will call primary provider for a hospital follow up appointment.   Spoke with patient's wife, states they continue to use [facility] for medications.  States their hospital indemnity supplemental insurance lapsed in February of 2017 while she was out due to a workers compensation injury (broken wrist).   States she has signed up to resume their supplemental insurance in 2018. Wife states patient does not have any transition of care, care coordination, disease management, disease monitoring, transportation, community resource, or pharmacy needs at this time.   States they are very appreciative of RNCM follow up and are in agreement to receiving [facility] Management information.  Objective:Per chart review: Patient hospitalized 01/27/16 -01/28/16 for Renal Mass. Status post Right robot assisted laparoscopic radical partial nephrectomy , Intra-operative ultrasound, and Lysis of adhesions, moderate on 01/27/16. Patient hospitalized 08/11/15 -08/13/15 for Acute cholecystitis. Status post LAPAROSCOPIC CHOLECYSTECTOMY WITH INTRAOPERATIVE CHOLANGIOGRAM on 08/12/15. ED visit on 10/22/15 arm injury, status post fall. Patient had Displaced  intra-articular fracture, 4 part, right distal radius. Status post Open reduction and internal fixation above using wide DVR plate and screws, on 10/26/15. Patient also has a history of right renal mass, Chronic hepatitis C, and hyperlipidemia.   Assessment: Received UMR Preoperative Call referral on 01/09/16 via [facility] Utilization Management Detail report. Preoperative call completed. Transition of care follow up completed, no care management needs, and will proceed with case closure.  Plan: RNCM will send patient successful outreach, [facility] pamphlet, and magnet. RNCM will send case closure due to follow up completed / no care management needs request to [facility] [place] [facility].      Araina Butrick H. Annia Friendly, BSN, [place] Management [facility] Telephonic CM Phone: [phone] Fax: [phone]

## 2016-02-07 DIAGNOSIS — S52571D Other intraarticular fracture of lower end of right radius, subsequent encounter for closed fracture with routine healing: Secondary | ICD-10-CM | POA: Diagnosis not present

## 2016-02-07 DIAGNOSIS — S52571A Other intraarticular fracture of lower end of right radius, initial encounter for closed fracture: Secondary | ICD-10-CM | POA: Diagnosis not present

## 2016-02-09 NOTE — Telephone Encounter (Signed)
This encounter was created in error - please disregard.

## 2016-02-09 NOTE — Addendum Note (Signed)
Addended by: Serena Colonel on: 02/09/2016 12:15 PM   Modules accepted: Miquel Dunn

## 2016-02-10 DIAGNOSIS — C641 Malignant neoplasm of right kidney, except renal pelvis: Secondary | ICD-10-CM | POA: Diagnosis not present

## 2016-02-20 MED FILL — DICLOFENAC SODIUM 1% GEL: 1 | 25 days supply | Qty: 100 | Fill #0

## 2016-02-20 MED FILL — ATORVASTATIN 20 MG TABLET: 20 | 90 days supply | Qty: 90 | Fill #0

## 2016-03-02 DIAGNOSIS — C641 Malignant neoplasm of right kidney, except renal pelvis: Secondary | ICD-10-CM | POA: Diagnosis not present

## 2016-04-06 DIAGNOSIS — C641 Malignant neoplasm of right kidney, except renal pelvis: Secondary | ICD-10-CM | POA: Diagnosis not present

## 2016-05-31 DIAGNOSIS — K740 Hepatic fibrosis: Secondary | ICD-10-CM | POA: Diagnosis not present

## 2016-05-31 DIAGNOSIS — B182 Chronic viral hepatitis C: Secondary | ICD-10-CM | POA: Diagnosis not present

## 2016-06-07 DIAGNOSIS — E7800 Pure hypercholesterolemia, unspecified: Secondary | ICD-10-CM | POA: Diagnosis not present

## 2016-06-07 DIAGNOSIS — Z8619 Personal history of other infectious and parasitic diseases: Secondary | ICD-10-CM | POA: Diagnosis not present

## 2016-06-07 DIAGNOSIS — R972 Elevated prostate specific antigen [PSA]: Secondary | ICD-10-CM | POA: Diagnosis not present

## 2016-06-07 DIAGNOSIS — Z125 Encounter for screening for malignant neoplasm of prostate: Secondary | ICD-10-CM | POA: Diagnosis not present

## 2016-06-07 DIAGNOSIS — Z0000 Encounter for general adult medical examination without abnormal findings: Secondary | ICD-10-CM | POA: Diagnosis not present

## 2016-06-07 DIAGNOSIS — Z85528 Personal history of other malignant neoplasm of kidney: Secondary | ICD-10-CM | POA: Diagnosis not present

## 2016-06-07 DIAGNOSIS — R911 Solitary pulmonary nodule: Secondary | ICD-10-CM | POA: Diagnosis not present

## 2016-06-11 ENCOUNTER — Other Ambulatory Visit (HOSPITAL_COMMUNITY): Payer: Self-pay | Admitting: Physician Assistant

## 2016-06-11 DIAGNOSIS — R911 Solitary pulmonary nodule: Secondary | ICD-10-CM

## 2016-06-15 ENCOUNTER — Ambulatory Visit (HOSPITAL_COMMUNITY)
Admission: RE | Admit: 2016-06-15 | Discharge: 2016-06-15 | Disposition: A | Discharge status: Home / Self Care | Payer: 59 | Source: Ambulatory Visit | Attending: Physician Assistant | Admitting: Physician Assistant

## 2016-06-15 DIAGNOSIS — R911 Solitary pulmonary nodule: Secondary | ICD-10-CM | POA: Insufficient documentation

## 2016-06-15 DIAGNOSIS — I712 Thoracic aortic aneurysm, without rupture: Secondary | ICD-10-CM | POA: Diagnosis not present

## 2016-06-18 MED FILL — ATORVASTATIN 20 MG TABLET: 20 | 90 days supply | Qty: 90 | Fill #1

## 2016-07-03 ENCOUNTER — Encounter: Payer: Self-pay | Admitting: Thoracic Surgery (Cardiothoracic Vascular Surgery)

## 2016-07-03 ENCOUNTER — Institutional Professional Consult (permissible substitution) (INDEPENDENT_AMBULATORY_CARE_PROVIDER_SITE_OTHER): Payer: 59 | Admitting: Thoracic Surgery (Cardiothoracic Vascular Surgery)

## 2016-07-03 VITALS — BP 150/99 | HR 77 | Resp 20 | Ht 73.0 in | Wt 185.0 lb

## 2016-07-03 DIAGNOSIS — I77819 Aortic ectasia, unspecified site: Secondary | ICD-10-CM

## 2016-07-03 DIAGNOSIS — R911 Solitary pulmonary nodule: Secondary | ICD-10-CM

## 2016-07-03 NOTE — Progress Notes (Signed)
PCP is REDMON,NOELLE, PA-C Referring Provider is Lennie Odor, PA-C  Chief Complaint  Patient presents with  . Lung Lesion    Surgical eval, Chest CT 06/15/16  . Thoracic Aortic Aneurysm    HPI:Adam [name] is a 61 year old gentleman with a past medical history significant for hepatitis C (completed treatment), renal cell carcinoma, arthritis, hyperlipidemia, and BPH. In May 2017 he had acute cholecystitis requiring cholecystectomy. During that admission he had a CT of the abdomen which showed a renal mass. That ultimately turned out to be a renal cell carcinoma and he underwent a partial nephrectomy in October. There also was a 7-8 mm left lower lobe nodule noted on that CT as well.  He recently saw Dr. Barrie Folk who ordered a repeat CT of the chest. It showed the nodule was about the same in size but there may be an increase in spiculation around the nodule. He was also noted to have a 4.1 cm ascending aneurysm.  He has been feeling well with no chest pain or shortness of breath. He does have arthritis which is long-standing. He denies any decrease in appetite or weight loss. He started smoking at age 46 and smoked for about 15 years before quitting 33 years ago. He never smoked as much as a pack of cigarettes daily. Zubrod Score: At the time of surgery this patient's most appropriate activity status/level should be described as: [x]     0    Normal activity, no symptoms []     1    Restricted in physical strenuous activity but ambulatory, able to do out light work []     2    Ambulatory and capable of self care, unable to do work activities, up and about >50 % of waking hours                              []     3    Only limited self care, in bed greater than 50% of waking hours []     4    Completely disabled, no self care, confined to bed or chair []     5    Moribund   Past Medical History:  Diagnosis Date  . Anxiety    with some things  . Arthritis   . BPH (benign prostatic hyperplasia)    . Chronic hepatitis C without hepatic coma ([place]) 08/16/2014   finished treatment 4-5 mos ago, CM[facility] in [facility] 08-11-15  . H/O knee surgery 08/16/2014  . Hepatitis C   . Hyperlipidemia 08/16/2014  . IGT (impaired glucose tolerance)   . Right renal mass     Past Surgical History:  Procedure Laterality Date  . CHOLECYSTECTOMY N/A 08/12/2015   Procedure: LAPAROSCOPIC CHOLECYSTECTOMY WITH INTRAOPERATIVE CHOLANGIOGRAM;  Surgeon: Ralene Ok, MD;  Location: [place];  Service: General;  Laterality: N/A;  . KNEE ARTHROSCOPY Bilateral   . OPEN REDUCTION INTERNAL FIXATION (ORIF) DISTAL RADIAL FRACTURE Right 10/26/2015   Procedure: OPEN REDUCTION INTERNAL FIXATION (ORIF) DISTAL RADIAL FRACTURE;  Surgeon: Charlotte Crumb, MD;  Location: [place];  Service: Orthopedics;  Laterality: Right;  . ROBOTIC ASSITED PARTIAL NEPHRECTOMY Right 01/27/2016   Procedure: XI ROBOTIC ASSITED PARTIAL NEPHRECTOMY;  Surgeon: Cleon Gustin, MD;  Location: [facility];  Service: Urology;  Laterality: Right;    Family History  Problem Relation Age of Onset  . Heart disease Mother   . Hyperlipidemia Mother   . Hypertension Father   . Cancer  Father   . Dementia Father   . Diabetes Sister     Social History Social History  Substance Use Topics  . Smoking status: Never Smoker  . Smokeless tobacco: Never Used  . Alcohol use No    Current Outpatient Prescriptions  Medication Sig Dispense Refill  . atorvastatin (LIPITOR) 40 MG tablet Take 40 mg by mouth daily at 6 PM.     No current facility-administered medications for this visit.     No Known Allergies  Review of Systems  Constitutional: Negative for activity change, appetite change and unexpected weight change.  HENT: Positive for dental problem. Negative for trouble swallowing and voice change.   Eyes: Negative for visual disturbance.  Respiratory: Negative for cough, shortness of breath and wheezing.   Cardiovascular: Negative for chest  pain and leg swelling.  Gastrointestinal: Negative for abdominal pain and blood in stool.  Genitourinary: Negative for difficulty urinating and dysuria.  Musculoskeletal: Positive for arthralgias and myalgias.  Neurological: Negative for dizziness, syncope and weakness.  Hematological: Negative for adenopathy. Does not bruise/bleed easily.  Psychiatric/Behavioral: The patient is nervous/anxious.     BP (!) 150/99 (BP Location: Left Arm, Cuff Size: Normal)   Pulse 77   Resp 20   Ht 6\' 1"  (1.854 m)   Wt 185 lb (83.9 kg)   SpO2 96% Comment: RA  BMI 24.41 kg/m  Physical Exam  Constitutional: He is oriented to person, place, and time. He appears well-developed and well-nourished. No distress.  HENT:  Head: Normocephalic and atraumatic.  Mouth/Throat: No oropharyngeal exudate.  Eyes: Conjunctivae and EOM are normal. No scleral icterus.  Neck: Neck supple. No thyromegaly present.  Cardiovascular: Normal rate, regular rhythm, normal heart sounds and intact distal pulses.   No murmur heard. Pulmonary/Chest: Effort normal and breath sounds normal. No respiratory distress. He has no wheezes.  Abdominal: Soft. He exhibits no distension. There is no tenderness.  Musculoskeletal: Normal range of motion. He exhibits no edema.  Lymphadenopathy:    He has no cervical adenopathy.  Neurological: He is alert and oriented to person, place, and time. No cranial nerve deficit. He exhibits normal muscle tone.  Skin: Skin is warm and dry.  Vitals reviewed.    Diagnostic Tests: CT CHEST WITHOUT CONTRAST  TECHNIQUE: Multidetector CT imaging of the chest was performed following the standard protocol without IV contrast.  COMPARISON:  CT 08/12/2015  FINDINGS: Cardiovascular: Limited evaluation without intravenous contrast. Scattered atherosclerotic calcifications. Mild aneurysmal dilatation of the ascending aorta, measuring 4.1 cm in maximum diameter. Mild coronary artery calcifications.  Normal heart size. No pericardial effusion.  Mediastinum/Nodes: No significantly enlarged mediastinal lymph nodes. Few calcified lymph nodes, consistent with granulomatous disease. Thyroid grossly normal. Trachea midline. The esophagus is unremarkable.  Lungs/Pleura: Linear atelectasis in the right lower lobe. No acute consolidation or effusion. No pneumothorax. Again visualized in the left lower lobe is an 8 by 5 mm, 7 mm average diameter pulmonary nodule. There is surrounding ground-glass density with fine spiculation suggested. There are no other pulmonary nodules identified.  Upper Abdomen: Interval cholecystectomy. The previously noted complex right kidney lesion is better seen on the contrasted exam.  Musculoskeletal: No chest wall mass or suspicious bone lesions identified.  IMPRESSION: 1. Left lower lobe 7 mm pulmonary nodule, grossly stable in size, however mild surrounding ground-glass density and fine spicules appear more prominent compared to prior. Follow-up noncontrast CT recommended at 3-6 months, given slight increase in size of the nodule and suggestion of mild spiculation around  the nodule. 2. Mild aneurysmal dilatation of the ascending aorta measuring up to 4.1 cm. Recommend annual imaging followup by CTA or MRA. This recommendation follows 2010 ACCF/AHA/AATS/ACR/ASA/SCA/SCAI/SIR/STS/SVM Guidelines for the Diagnosis and Management of Patients with Thoracic Aortic Disease. Circulation. 2010; 121: H606-V703   Electronically Signed   By: Donavan Foil M.D.   On: 06/15/2016 15:20 I personally reviewed the CT chest and compared it to the CT of the abdomen from last May. I concur with the findings as noted above. I suspect the changes around the nodule are more related to differences in technique with the scans than any true change.  Impression: Adam [name] is a 61 year old gentleman with a history of renal cell carcinoma, hyperlipidemia, and hepatitis C.  He has a remote history of like tobacco abuse, but quit 33 years ago. He was incidentally found to have a 7 mm lung nodule on abdominal CT. Now 10 months later the nodule is unchanged in size, but there may be an increase in groundglass spiculation around the nodule. I suspected the difference is more related to the technique of the scan as the picture is much clearer on the current scan that was on the previous. He is a low risk patient for primary lung cancer and this does not have an appearance typical for metastatic disease. I think a repeat chest CT in 6 months would be appropriate for follow-up.  Ascending aneurysm- 4.1 cm. Cut off in size for surgery in this area is 5.5 cm or growth of more than 5 mm in a 6 month period. There is no indication for surgery at this time. He does need follow-up. Normally we would follow him for this annually, but he needs a CT in 6 months to look at the lung nodule.  Hypertension- he denies any hypertension and says he has "white coat syndrome." I emphasized the importance of blood pressure control in the management of his small aneurysm. His wife has a blood pressure cuff at home. She will check them on a daily basis. If it remains elevated he will contact either Dr. Barrie Folk or myself. The first line of treatment would be beta blockers if his blood pressure remains elevated. Our goal would be less than 403 systolic.  Plan:  Return in 6 months with CT of chest to follow-up ascending aneurysm and left lung nodule  Melrose Nakayama, MD [facility] [phone]

## 2016-07-06 DIAGNOSIS — C641 Malignant neoplasm of right kidney, except renal pelvis: Secondary | ICD-10-CM | POA: Diagnosis not present

## 2016-07-06 DIAGNOSIS — R972 Elevated prostate specific antigen [PSA]: Secondary | ICD-10-CM | POA: Diagnosis not present

## 2016-07-13 DIAGNOSIS — C641 Malignant neoplasm of right kidney, except renal pelvis: Secondary | ICD-10-CM | POA: Diagnosis not present

## 2016-07-13 DIAGNOSIS — R972 Elevated prostate specific antigen [PSA]: Secondary | ICD-10-CM | POA: Diagnosis not present

## 2016-09-26 MED FILL — ATORVASTATIN 40 MG TABLET: 40 | 30 days supply | Qty: 30 | Fill #0

## 2016-10-15 DIAGNOSIS — H524 Presbyopia: Secondary | ICD-10-CM | POA: Diagnosis not present

## 2016-10-15 DIAGNOSIS — H5203 Hypermetropia, bilateral: Secondary | ICD-10-CM | POA: Diagnosis not present

## 2016-10-24 DIAGNOSIS — E7800 Pure hypercholesterolemia, unspecified: Secondary | ICD-10-CM | POA: Diagnosis not present

## 2016-10-24 DIAGNOSIS — R911 Solitary pulmonary nodule: Secondary | ICD-10-CM | POA: Diagnosis not present

## 2016-10-24 DIAGNOSIS — M259 Joint disorder, unspecified: Secondary | ICD-10-CM | POA: Diagnosis not present

## 2016-10-25 MED FILL — ATORVASTATIN 40 MG TABLET: 40 | 90 days supply | Qty: 90 | Fill #0

## 2016-12-10 DIAGNOSIS — E7800 Pure hypercholesterolemia, unspecified: Secondary | ICD-10-CM | POA: Diagnosis not present

## 2016-12-21 ENCOUNTER — Other Ambulatory Visit: Payer: Self-pay | Admitting: Thoracic Surgery (Cardiothoracic Vascular Surgery)

## 2016-12-21 DIAGNOSIS — I712 Thoracic aortic aneurysm, without rupture, unspecified: Secondary | ICD-10-CM

## 2017-01-11 ENCOUNTER — Other Ambulatory Visit: Payer: Self-pay | Admitting: Urology

## 2017-01-11 ENCOUNTER — Ambulatory Visit (HOSPITAL_COMMUNITY)
Admission: RE | Admit: 2017-01-11 | Discharge: 2017-01-11 | Disposition: A | Discharge status: Home / Self Care | Payer: 59 | Source: Ambulatory Visit | Attending: Urology | Admitting: Urology

## 2017-01-11 DIAGNOSIS — C641 Malignant neoplasm of right kidney, except renal pelvis: Secondary | ICD-10-CM | POA: Insufficient documentation

## 2017-01-11 DIAGNOSIS — R972 Elevated prostate specific antigen [PSA]: Secondary | ICD-10-CM | POA: Diagnosis not present

## 2017-01-23 ENCOUNTER — Ambulatory Visit: Payer: 59 | Admitting: Cardiothoracic Surgery

## 2017-01-23 ENCOUNTER — Ambulatory Visit (INDEPENDENT_AMBULATORY_CARE_PROVIDER_SITE_OTHER): Payer: 59 | Admitting: Thoracic Surgery (Cardiothoracic Vascular Surgery)

## 2017-01-23 ENCOUNTER — Encounter: Payer: Self-pay | Admitting: Thoracic Surgery (Cardiothoracic Vascular Surgery)

## 2017-01-23 ENCOUNTER — Ambulatory Visit
Admission: RE | Admit: 2017-01-23 | Discharge: 2017-01-23 | Disposition: A | Discharge status: Home / Self Care | Payer: 59 | Source: Ambulatory Visit | Attending: Thoracic Surgery (Cardiothoracic Vascular Surgery) | Admitting: Thoracic Surgery (Cardiothoracic Vascular Surgery)

## 2017-01-23 VITALS — BP 129/95 | HR 77 | Ht 73.0 in | Wt 188.0 lb

## 2017-01-23 DIAGNOSIS — I77819 Aortic ectasia, unspecified site: Secondary | ICD-10-CM

## 2017-01-23 DIAGNOSIS — R911 Solitary pulmonary nodule: Secondary | ICD-10-CM

## 2017-01-23 DIAGNOSIS — I712 Thoracic aortic aneurysm, without rupture, unspecified: Secondary | ICD-10-CM

## 2017-01-23 NOTE — Progress Notes (Signed)
[address]       [place] [address]             [phone]       HPI: Adam [name] returns today for scheduled follow-up visit  He is a 61 year old gentleman with a history of treated hepatitis C, renal cell carcinoma, arthritis, hyperlipidemia, and benign prostatic hypertrophy.  In May 2017 he had acute cholecystitis.  As part of his workup he had a CT of the abdomen which showed a renal mass.  That turned out to be renal carcinoma and he underwent a partial nephrectomy in October 2017.  Also noted on his CT was a 7-8 mm left lower lobe nodule.  Repeat CT of the chest was done in April 2018.  This showed no change in the size of the nodule but there was felt to be an increase in spiculation around it.  There also was a 4.1 cm aneurysm.  I saw him in April 2018.  He does have a history of tobacco abuse but quit 34 years ago.  In the interim since his last visit he has been feeling well.  He has not had any new medical problems.  He denies any chest pain, pressure, or tightness.  He denies shortness of breath, cough, wheezing.  Past Medical History:  Diagnosis Date  . Anxiety    with some things  . Arthritis   . BPH (benign prostatic hyperplasia)   . Chronic hepatitis C without hepatic coma ([place]) 08/16/2014   finished treatment 4-5 mos ago, CMET in [facility] 08-11-15  . H/O knee surgery 08/16/2014  . Hepatitis C   . Hyperlipidemia 08/16/2014  . IGT (impaired glucose tolerance)   . Right renal mass     Current Outpatient Prescriptions  Medication Sig Dispense Refill  . atorvastatin (LIPITOR) 40 MG tablet Take 40 mg by mouth daily at 6 PM.     No current facility-administered medications for this visit.     Physical Exam BP (!) 129/95   Pulse 77   Ht 6\' 1"  (1.854 m)   Wt 188 lb (85.3 kg)   SpO2 99%   BMI 24.75 kg/m  61 year old man in no acute distress Alert and oriented x3 with no focal deficits No carotid bruits Cardiac regular rate and rhythm normal  S1-S2 Lungs clear with equal breath sounds bilaterally  Diagnostic Tests: CT CHEST WITHOUT CONTRAST  TECHNIQUE: Multidetector CT imaging of the chest was performed following the standard protocol without IV contrast.  COMPARISON:  06/15/2016  FINDINGS: Cardiovascular: Mild scattered coronary calcifications. Normal heart size. No pericardial effusion.  Thoracic aortic aneurysm with dimensions as follows:  4.1 cm mid ascending (previously 4.1)  3.6 cm distal ascending/proximal arch  3.2 cm distal arch  The descending thoracic and visualized proximal abdominal segments are normal in caliber. Evaluation is limited without IV contrast. Scattered atheromatous calcifications in the arch and proximal descending segment.  Mediastinum/Nodes: Partially calcified subcentimeter precarinal node as before. No pathologically enlarged axillary, mediastinal or hilar adenopathy.  Lungs/Pleura: Stable linear scarring or atelectasis in the posterior right lower lobe. 0.7 cm subpleural nodule, lateral basal segments left lower lobe, previously 0.8 cm. No new pulmonary nodule or infiltrate. No pleural effusion. No pneumothorax.  Upper Abdomen: Previous cholecystectomy. Changes of partial right nephrectomy. No acute findings.  Musculoskeletal: Mild spondylitic changes in the thoracic and lower cervical spine. No fracture or worrisome bone lesion.  IMPRESSION: 1. Stable 4.1 cm ascending aortic aneurysm without  complicating features. 2. Stable 0.7 cm left lower lobe pulmonary nodule. Follow-up Non-contrast chest CT at 12-18 months is recommended . This recommendation follows the consensus statement: Guidelines for Management of Incidental Pulmonary Nodules Detected on CT Images: From the Fleischner Society 2017; Radiology 2017; 284:228-243. 3. Coronary and Aortic Atherosclerosis (ICD10-170.0)   Electronically Signed   By: Lucrezia Europe M.D.   On: 01/23/2017 09:28 I  personally reviewed the CT images and concur with the findings as noted above  Impression: Adam [name] is a 61 year old gentleman with a history of partial nephrectomy for renal cell carcinoma, treated hepatitis C, hyperlipidemia, arthritis, and anxiety.  I am following him for a 4.1 cm ascending aneurysm and a left lower lobe lung nodule.  Ascending aneurysm-unchanged at 4.1 cm.  His systolic pressure was normal today his diastolic was mildly elevated.  I recommended that he get a blood pressure cuff and check himself at home to see if this persists.  Needs repeat CT in 1 year.  Lung nodule-7 mm nodule in the left lower lobe unchanged over multiple scans.  He needs a repeat CT in 12 months before we can officially consider that benign.  Plan:  Return in 1 year with CT chest  Melrose Nakayama, MD [facility] [phone]

## 2017-02-04 MED FILL — ATORVASTATIN 40 MG TABLET: 40 | 90 days supply | Qty: 90 | Fill #1

## 2017-04-18 DIAGNOSIS — Z86010 Personal history of colonic polyps: Secondary | ICD-10-CM | POA: Diagnosis not present

## 2017-04-18 DIAGNOSIS — R635 Abnormal weight gain: Secondary | ICD-10-CM | POA: Diagnosis not present

## 2017-04-18 DIAGNOSIS — Z1211 Encounter for screening for malignant neoplasm of colon: Secondary | ICD-10-CM | POA: Diagnosis not present

## 2017-04-18 DIAGNOSIS — K5730 Diverticulosis of large intestine without perforation or abscess without bleeding: Secondary | ICD-10-CM | POA: Diagnosis not present

## 2017-04-18 DIAGNOSIS — B182 Chronic viral hepatitis C: Secondary | ICD-10-CM | POA: Diagnosis not present

## 2017-05-27 MED FILL — PEG-3350 AND ELECTROLYTES S: 236 | 1 days supply | Qty: 4000 | Fill #0

## 2017-06-03 DIAGNOSIS — K621 Rectal polyp: Secondary | ICD-10-CM | POA: Diagnosis not present

## 2017-06-03 DIAGNOSIS — D128 Benign neoplasm of rectum: Secondary | ICD-10-CM | POA: Diagnosis not present

## 2017-06-03 DIAGNOSIS — Z8371 Family history of colonic polyps: Secondary | ICD-10-CM | POA: Diagnosis not present

## 2017-06-03 DIAGNOSIS — K635 Polyp of colon: Secondary | ICD-10-CM | POA: Diagnosis not present

## 2017-06-03 DIAGNOSIS — Z1211 Encounter for screening for malignant neoplasm of colon: Secondary | ICD-10-CM | POA: Diagnosis not present

## 2017-06-03 DIAGNOSIS — D127 Benign neoplasm of rectosigmoid junction: Secondary | ICD-10-CM | POA: Diagnosis not present

## 2017-06-18 MED FILL — ATORVASTATIN 40 MG TABLET: 40 | 30 days supply | Qty: 30 | Fill #2

## 2017-07-29 DIAGNOSIS — Z0000 Encounter for general adult medical examination without abnormal findings: Secondary | ICD-10-CM | POA: Diagnosis not present

## 2017-07-29 DIAGNOSIS — Z8619 Personal history of other infectious and parasitic diseases: Secondary | ICD-10-CM | POA: Diagnosis not present

## 2017-07-29 DIAGNOSIS — E7800 Pure hypercholesterolemia, unspecified: Secondary | ICD-10-CM | POA: Diagnosis not present

## 2017-07-29 DIAGNOSIS — R7309 Other abnormal glucose: Secondary | ICD-10-CM | POA: Diagnosis not present

## 2017-07-29 DIAGNOSIS — Z85528 Personal history of other malignant neoplasm of kidney: Secondary | ICD-10-CM | POA: Diagnosis not present

## 2017-08-02 MED FILL — ATORVASTATIN 80 MG TABLET: 80 | 90 days supply | Qty: 90 | Fill #0

## 2017-08-02 MED FILL — LISINOPRIL 10 MG TABS: 10 | 90 days supply | Qty: 90 | Fill #0

## 2017-09-05 ENCOUNTER — Ambulatory Visit: Payer: 59

## 2017-09-10 DIAGNOSIS — E1169 Type 2 diabetes mellitus with other specified complication: Secondary | ICD-10-CM | POA: Diagnosis not present

## 2017-09-12 ENCOUNTER — Ambulatory Visit: Payer: 59

## 2017-09-19 ENCOUNTER — Ambulatory Visit: Payer: 59

## 2017-10-03 ENCOUNTER — Ambulatory Visit (HOSPITAL_COMMUNITY)
Admission: EM | Admit: 2017-10-03 | Discharge: 2017-10-03 | Disposition: A | Discharge status: Home / Self Care | Payer: 59 | Attending: Family Medicine | Admitting: Family Medicine

## 2017-10-03 ENCOUNTER — Other Ambulatory Visit: Payer: Self-pay

## 2017-10-03 ENCOUNTER — Encounter (HOSPITAL_COMMUNITY): Payer: Self-pay | Admitting: Emergency Medicine

## 2017-10-03 DIAGNOSIS — R197 Diarrhea, unspecified: Secondary | ICD-10-CM | POA: Diagnosis not present

## 2017-10-03 DIAGNOSIS — R42 Dizziness and giddiness: Secondary | ICD-10-CM | POA: Diagnosis not present

## 2017-10-03 DIAGNOSIS — R109 Unspecified abdominal pain: Secondary | ICD-10-CM | POA: Diagnosis not present

## 2017-10-03 DIAGNOSIS — I10 Essential (primary) hypertension: Secondary | ICD-10-CM | POA: Diagnosis not present

## 2017-10-03 LAB — POCT I-STAT, CHEM 8
BUN: 14 mg/dL (ref 8–23)
CALCIUM ION: 1.19 mmol/L (ref 1.15–1.40)
CREATININE: 1 mg/dL (ref 0.61–1.24)
Chloride: 103 mmol/L (ref 98–111)
GLUCOSE: 131 mg/dL — AB (ref 70–99)
HCT: 47 % (ref 39.0–52.0)
HEMOGLOBIN: 16 g/dL (ref 13.0–17.0)
POTASSIUM: 4.1 mmol/L (ref 3.5–5.1)
Sodium: 142 mmol/L (ref 135–145)
TCO2: 25 mmol/L (ref 22–32)

## 2017-10-03 MED ORDER — ONDANSETRON 4 MG PO TBDP: 4.0000 mg | ORAL_TABLET | Freq: Three times a day (TID) | ORAL | 0 refills | Status: AC | PRN

## 2017-10-03 NOTE — ED Triage Notes (Signed)
Monday was dizzy and vomiting and diarrhea Tuesday ate very light, not dizzy-diarrhea continued Wednesday was feeling ok, continued diarrhea Today got up dizzy and vomiting, hot flashes, sweating, diarrhea

## 2017-10-03 NOTE — ED Provider Notes (Signed)
[place]    CSN: [id number] Arrival date & time: 10/03/17  1125     History   Chief Complaint No chief complaint on file.   HPI Adam [name] is a 62 y.o. male.   The history is provided by the patient. No language interpreter was used.  Dizziness  Quality:  Head spinning and room spinning Severity:  Moderate Onset quality:  Gradual Timing:  Intermittent Progression:  Waxing and waning Chronicity:  New Relieved by:  Being still and lying down Worsened by:  Movement, standing up and closing eyes Ineffective treatments:  None tried Associated symptoms: diarrhea and vomiting   Associated symptoms: no chest pain, no headaches and no shortness of breath   Associated symptoms comment:  She vomited 3 times this morning. Vomiting started about the same time Risk factors: no hx of vertigo and no new medications   Diarrhea  Quality:  Watery (Greenish yellow, no blood) Duration:  3 days Timing:  Intermittent (diarrhea x 3 today) Relieved by:  Liquids (Chicken noddle soup) Worsened by:  Nothing Ineffective treatments:  None tried Associated symptoms: abdominal pain, chills, fever and vomiting   Associated symptoms: no headaches   Associated symptoms comment:  Subjective fever Hypertension  This is a chronic problem. The problem occurs constantly. Associated symptoms include abdominal pain. Pertinent negatives include no chest pain, no headaches and no shortness of breath. Nothing aggravates the symptoms. Nothing relieves the symptoms. Treatments tried: On 10 mg Lisinopril. Last dose was last night.    Past Medical History:  Diagnosis Date  . Anxiety    with some things  . Arthritis   . BPH (benign prostatic hyperplasia)   . Chronic hepatitis C without hepatic coma ([place]) 08/16/2014   finished treatment 4-5 mos ago, CMET in [facility] 08-11-15  . H/O knee surgery 08/16/2014  . Hepatitis C   . Hyperlipidemia 08/16/2014  . IGT (impaired glucose tolerance)   . Right  renal mass     Patient Active Problem List   Diagnosis Date Noted  . Renal mass 01/27/2016  . Acute cholecystitis 08/12/2015  . Chronic hepatitis C without hepatic coma ([place]) 08/16/2014  . HTN (hypertension) 08/16/2014  . H/O knee surgery 08/16/2014  . Hyperlipidemia 08/16/2014    Past Surgical History:  Procedure Laterality Date  . CHOLECYSTECTOMY N/A 08/12/2015   Procedure: LAPAROSCOPIC CHOLECYSTECTOMY WITH INTRAOPERATIVE CHOLANGIOGRAM;  Surgeon: Ralene Ok, MD;  Location: [facility];  Service: General;  Laterality: N/A;  . KNEE ARTHROSCOPY Bilateral   . OPEN REDUCTION INTERNAL FIXATION (ORIF) DISTAL RADIAL FRACTURE Right 10/26/2015   Procedure: OPEN REDUCTION INTERNAL FIXATION (ORIF) DISTAL RADIAL FRACTURE;  Surgeon: Charlotte Crumb, MD;  Location: [facility];  Service: Orthopedics;  Laterality: Right;  . ROBOTIC ASSITED PARTIAL NEPHRECTOMY Right 01/27/2016   Procedure: XI ROBOTIC ASSITED PARTIAL NEPHRECTOMY;  Surgeon: Cleon Gustin, MD;  Location: [facility];  Service: Urology;  Laterality: Right;       Home Medications    Prior to Admission medications   Medication Sig Start Date End Date Taking? Authorizing Provider  NON FORMULARY Taking a medicine to help kidneys-cannot remember name of medicine   Yes [provider]  atorvastatin (LIPITOR) 40 MG tablet Take 40 mg by mouth daily at 6 PM.    [provider]    Family History Family History  Problem Relation Age of Onset  . Heart disease Mother   . Hyperlipidemia Mother   . Hypertension Father   . Cancer Father   .  Dementia Father   . Diabetes Sister     Social History Social History   Tobacco Use  . Smoking status: Never Smoker  . Smokeless tobacco: Never Used  Substance Use Topics  . Alcohol use: No  . Drug use: Yes    Types: Marijuana    Comment: last joint 01/24/16     Allergies   Patient has no known allergies.   Review of Systems Review of Systems    Constitutional: Positive for chills and fever.  Respiratory: Negative for shortness of breath.   Cardiovascular: Negative for chest pain.  Gastrointestinal: Positive for abdominal pain, diarrhea and vomiting.  Neurological: Positive for dizziness. Negative for headaches.  All other systems reviewed and are negative.    Physical Exam Triage Vital Signs ED Triage Vitals  Enc Vitals Group     BP 10/03/17 1148 (!) 158/94     Pulse Rate 10/03/17 1148 65     Resp 10/03/17 1148 18     Temp 10/03/17 1148 98 F (36.7 C)     Temp Source 10/03/17 1148 Oral     SpO2 10/03/17 1148 100 %     Weight --      Height --      Head Circumference --      Peak Flow --      Pain Score 10/03/17 1145 0     Pain Loc --      Pain Edu? --      Excl. in [place]? --    No data found.  Updated Vital Signs BP (!) 158/94 (BP Location: Left Arm)   Pulse 65   Temp 98 F (36.7 C) (Oral)   Resp 18   SpO2 100%   Visual Acuity Right Eye Distance:   Left Eye Distance:   Bilateral Distance:    Right Eye Near:   Left Eye Near:    Bilateral Near:     Physical Exam  Constitutional: He is oriented to person, place, and time. He appears well-developed. No distress.  Cardiovascular: Normal rate, regular rhythm and normal heart sounds.  No murmur heard. Pulmonary/Chest: Effort normal. No stridor. No respiratory distress. He has no wheezes.  Abdominal: Soft. Bowel sounds are normal. He exhibits no distension and no mass. There is no tenderness.  Musculoskeletal: He exhibits no edema.  Neurological: He is alert and oriented to person, place, and time. He displays no tremor. No cranial nerve deficit or sensory deficit. He exhibits normal muscle tone. Coordination and gait normal.  Skin: Skin is warm. Capillary refill takes less than 2 seconds.  Nursing note and vitals reviewed.    UC Treatments / Results  Labs (all labs ordered are listed, but only abnormal results are displayed) Labs Reviewed - No data to  display  EKG None  Radiology No results found.  Procedures Procedures (including critical care time)  Medications Ordered in UC Medications - No data to display  Initial Impression / Assessment and Plan / UC Course  I have reviewed the triage vital signs and the nursing notes.  Pertinent labs & imaging results that were available during my care of the patient were reviewed by me and considered in my medical decision making (see chart for details).  Clinical Course as of Oct 04 1222  Thu Oct 03, 2017  1220 BP elevated. Recently started on Lisinopril 10 mg. Continue same regimen. F/U with PCP in few days for meds adjustment and management. He agreed with the plan.   [KE]  1221 Gastroenteritis Abdominal pain,Vomiting and diarrhea. Abdominal exam benign. ?? Viral infection. We will manage conservatively with oral hydration,tylenol as needed for pain and rest at home. Return soon if persistent or worsening or unable to keep anything down. Zofran prn N/V    [KE]  1223 Dizziness, likely due to mild dehydration. Istat checked to r/o anemia or severe dehydration. Lab looks good. Vertigo a possibility but less likely in the setting on gastroenteritis. No neurologic deficit. Rest at home. Keep well hydrated. ED visit if worsening.   [KE]    Clinical Course User Index [KE] Kinnie Feil, MD    Dizziness and giddiness  Diarrhea, unspecified type  Abdominal pain, unspecified abdominal location  Essential hypertension   Final Clinical Impressions(s) / UC Diagnoses   Final diagnoses:  None   Discharge Instructions   None    ED Prescriptions    None     Controlled Substance Prescriptions [place] Controlled Substance Registry consulted? Not Applicable   Kinnie Feil, MD 10/03/17 1237

## 2017-10-03 NOTE — Discharge Instructions (Addendum)
It was nice seeing you. Your kidney test is normal. You don't have anemia. Dizziness likely due to excessive vomiting and diarrhea with mild dehydration. Please keep well hydrated and rest at home. I sent Zofran to pharmacy for vomiting. If symptoms worsens, please go to the ED.

## 2017-10-28 MED FILL — ATORVASTATIN 80 MG TABLET: 80 | 90 days supply | Qty: 90 | Fill #1

## 2017-10-28 MED FILL — LISINOPRIL 10 MG TABLET: 10 | 90 days supply | Qty: 90 | Fill #1

## 2017-11-04 DIAGNOSIS — E1169 Type 2 diabetes mellitus with other specified complication: Secondary | ICD-10-CM | POA: Diagnosis not present

## 2017-11-04 DIAGNOSIS — E7800 Pure hypercholesterolemia, unspecified: Secondary | ICD-10-CM | POA: Diagnosis not present

## 2017-11-06 MED FILL — metFORMIN HCL 500 MG TABS: 500 | 90 days supply | Qty: 180 | Fill #0

## 2017-12-11 ENCOUNTER — Other Ambulatory Visit: Payer: Self-pay | Admitting: Thoracic Surgery (Cardiothoracic Vascular Surgery)

## 2017-12-11 DIAGNOSIS — I712 Thoracic aortic aneurysm, without rupture, unspecified: Secondary | ICD-10-CM

## 2017-12-17 IMAGING — CT CT CHEST W/O CM
3 of 4 series · 17 of 30 positions shown, 19 images · non-contrast
Comparison: 06/15/2016

CLINICAL DATA: ANGEL/Marquez Nassar. No pt complaints. Nonsmoker x 30 yrs. Hx
RCC, partial nephrectomy 2805. Prev in pacs.

EXAM:
CT CHEST WITHOUT CONTRAST
TECHNIQUE: Multidetector CT imaging of the chest was performed following the
standard protocol without IV contrast.

[Series 3: chest w/o · axial · non-contrast · 0.70mm/px · z∈[-312,-32]mm · 8 of 144 slices shown, 10 images]
[im 16/144  mediastinal]
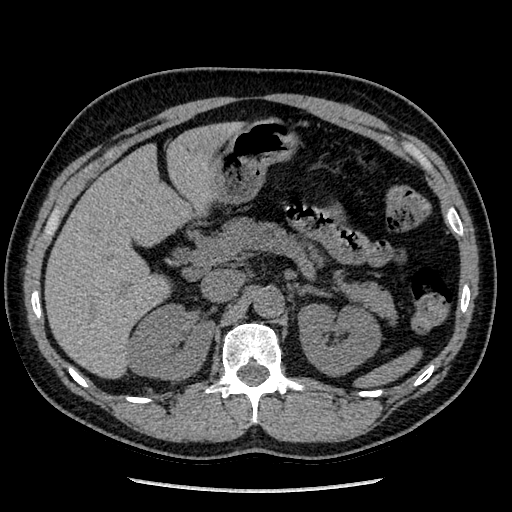
[im 16/144  lung]
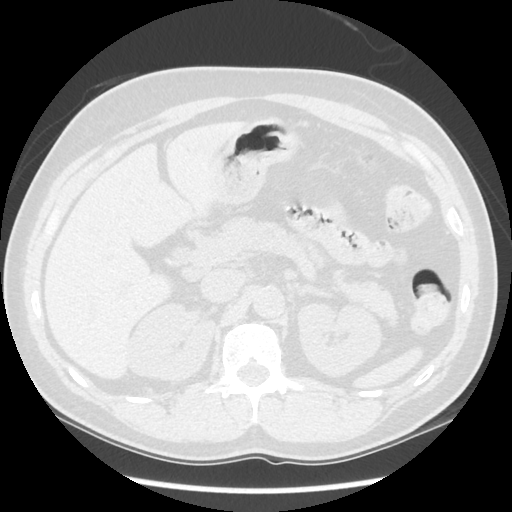
[im 32/144  lung]
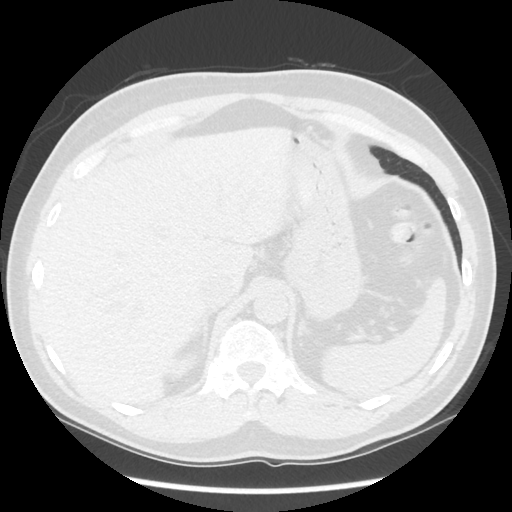
[im 48/144  lung]
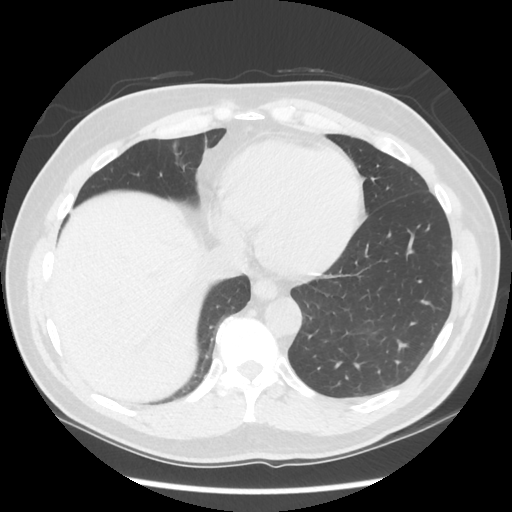
[im 64/144  lung]
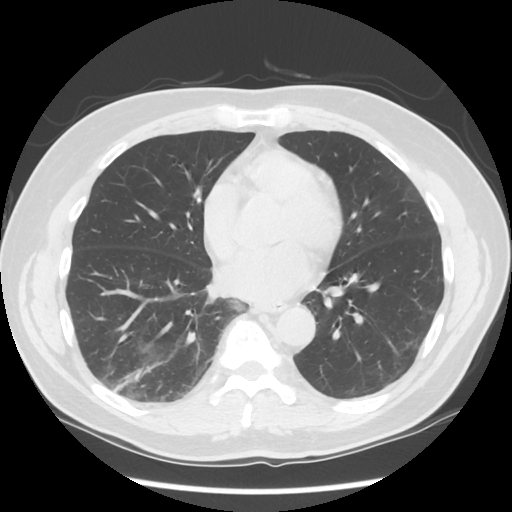
[im 80/144  mediastinal]
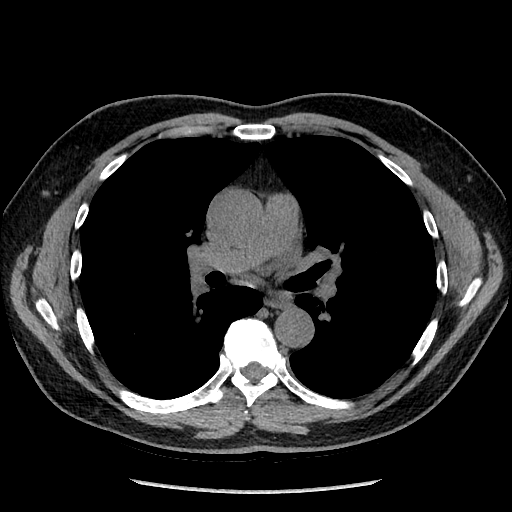
[im 80/144  lung]
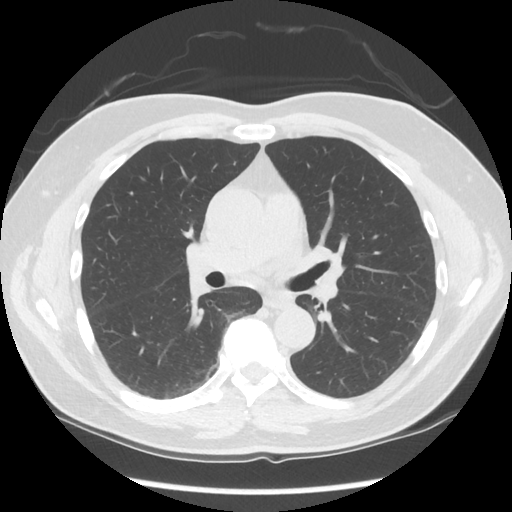
[im 96/144  lung]
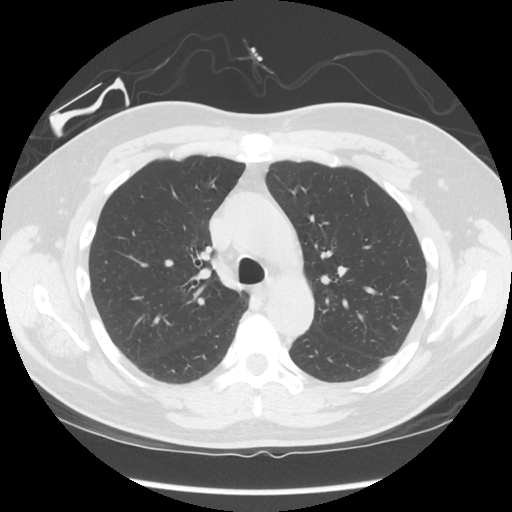
[im 112/144  lung]
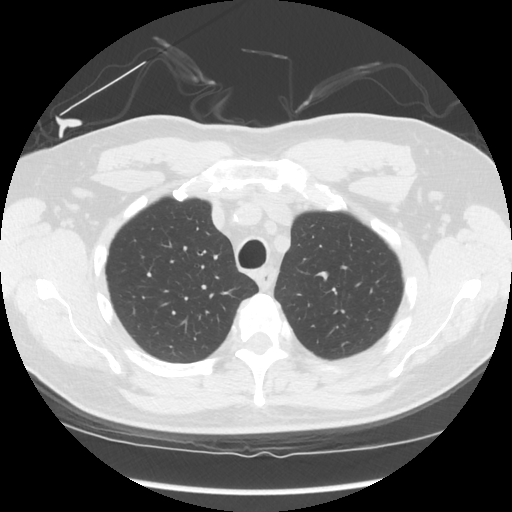
[im 128/144  lung]
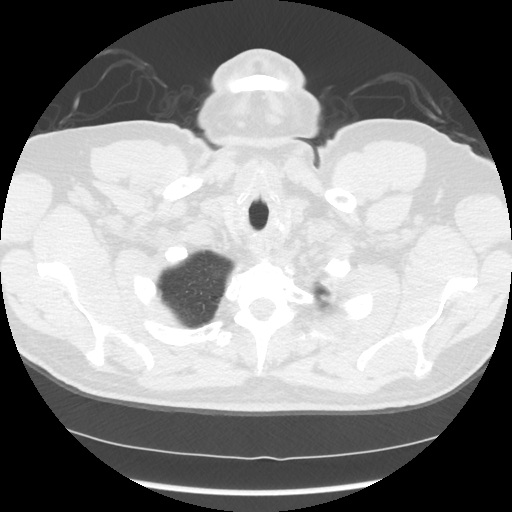

[Series 4: lung windows · axial · 0.70mm/px · z∈[-307,-37]mm · 7 of 144 slices shown]
[im 18/144  lung]
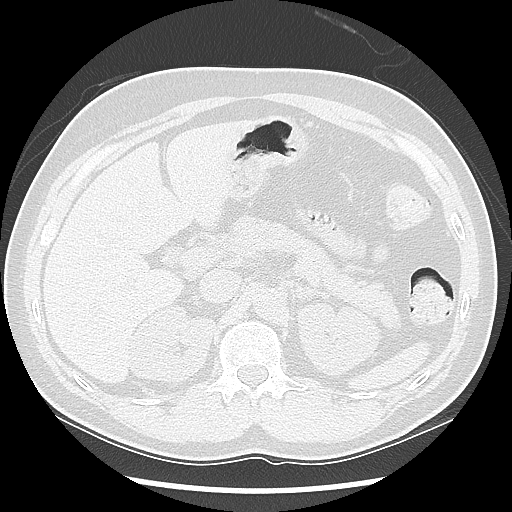
[im 36/144  lung]
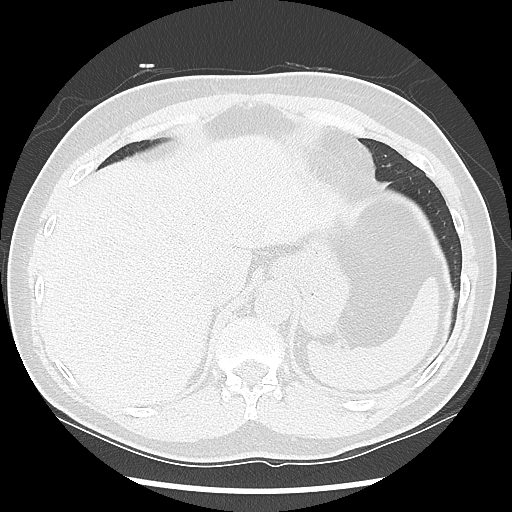
[im 54/144  lung]
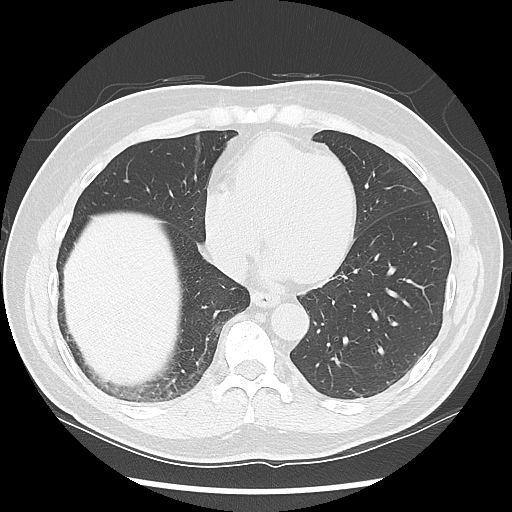
[im 72/144  lung]
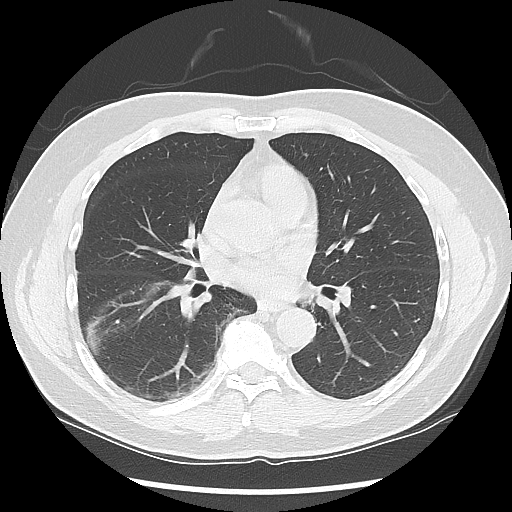
[im 90/144  lung]
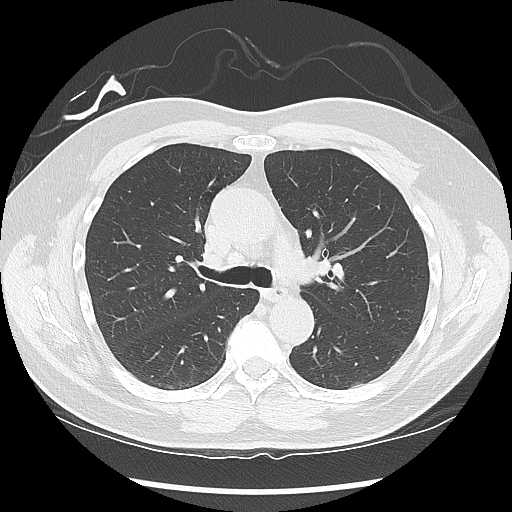
[im 108/144  lung]
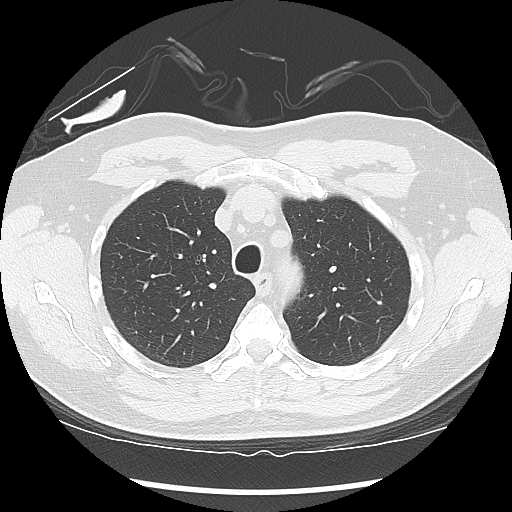
[im 126/144  lung]
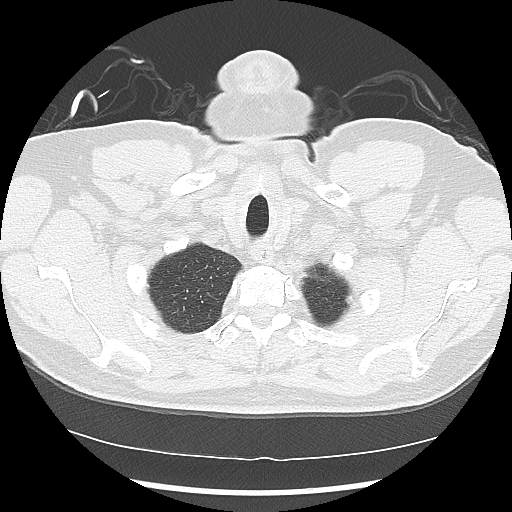

[Series 602: sagittal body · sagittal · 0.71mm/px · 2 of 145 slices shown]
[im 19/145  mediastinal]
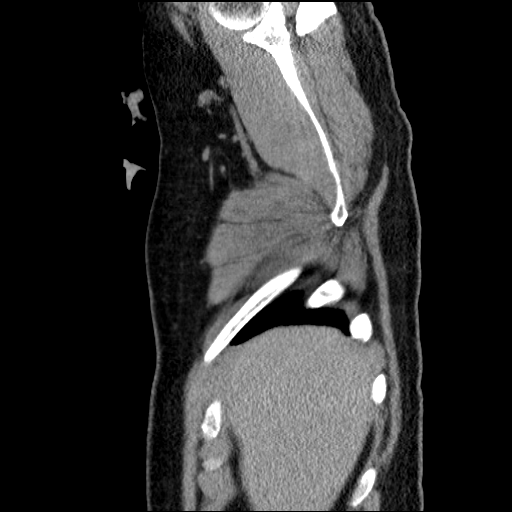
[im 37/145  mediastinal]
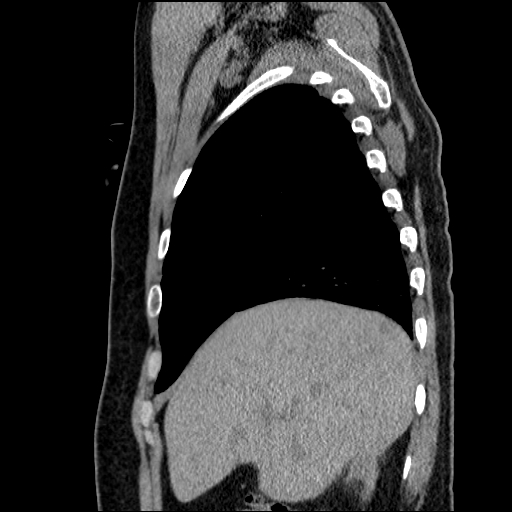

[17 of 30 positions shown; findings below may reference images not displayed]

FINDINGS: Cardiovascular: Mild scattered coronary calcifications. Normal heart
size. No pericardial effusion.

Thoracic aortic aneurysm with dimensions as follows:

4.1 cm mid ascending (previously 4.1)

3.6 cm distal ascending/proximal arch

3.2 cm distal arch

The descending thoracic and visualized proximal abdominal segments
are normal in caliber. Evaluation is limited without IV contrast.
Scattered atheromatous calcifications in the arch and proximal
descending segment.

Mediastinum/Nodes: Partially calcified subcentimeter precarinal node
as before. No pathologically enlarged axillary, mediastinal or hilar
adenopathy.

Lungs/Pleura: Stable linear scarring or atelectasis in the posterior
right lower lobe. 0.7 cm subpleural nodule, lateral basal segments
left lower lobe, previously 0.8 cm. No new pulmonary nodule or
infiltrate. No pleural effusion. No pneumothorax.

Upper Abdomen: Previous cholecystectomy. Changes of partial right
nephrectomy. No acute findings.

Musculoskeletal: Mild spondylitic changes in the thoracic and lower
cervical spine. No fracture or worrisome bone lesion.
IMPRESSION: 1. Stable 4.1 cm ascending aortic aneurysm without complicating
features.
2. Stable 0.7 cm left lower lobe pulmonary nodule. Follow-up
Non-contrast chest CT at 12-18 months is recommended . This
recommendation follows the consensus statement: Guidelines for
Management of Incidental Pulmonary Nodules Detected on CT Images:
3. Coronary and Aortic Atherosclerosis (5NCQV-170.0)

## 2018-01-28 ENCOUNTER — Ambulatory Visit: Payer: 59 | Admitting: Thoracic Surgery (Cardiothoracic Vascular Surgery)

## 2018-01-28 ENCOUNTER — Other Ambulatory Visit: Payer: Self-pay

## 2018-01-28 ENCOUNTER — Ambulatory Visit
Admission: RE | Admit: 2018-01-28 | Discharge: 2018-01-28 | Disposition: A | Discharge status: Home / Self Care | Payer: 59 | Source: Ambulatory Visit | Attending: Thoracic Surgery (Cardiothoracic Vascular Surgery) | Admitting: Thoracic Surgery (Cardiothoracic Vascular Surgery)

## 2018-01-28 ENCOUNTER — Encounter: Payer: Self-pay | Admitting: Thoracic Surgery (Cardiothoracic Vascular Surgery)

## 2018-01-28 VITALS — BP 126/88 | HR 65 | Resp 18 | Ht 73.0 in | Wt 170.0 lb

## 2018-01-28 DIAGNOSIS — I712 Thoracic aortic aneurysm, without rupture, unspecified: Secondary | ICD-10-CM

## 2018-01-28 DIAGNOSIS — R911 Solitary pulmonary nodule: Secondary | ICD-10-CM

## 2018-01-28 DIAGNOSIS — I7121 Aneurysm of the ascending aorta, without rupture: Secondary | ICD-10-CM

## 2018-01-28 NOTE — Progress Notes (Signed)
[address]       [place], [address]             [phone]      HPI: Mr. Adam [name] returns for scheduled follow-up visit  Adam [name] is a 62 year old man with a past history of hepatitis C (treated), renal cell carcinoma, arthritis, hyperlipidemia, and benign prostatic hypertrophy.  He had a renal cell carcinoma and underwent partial nephrectomy in October 2017.  Around that time he had a CT which showed a 7 mm left lower lobe lung nodule.  Repeat CT in April 2018 showed no change in the nodule, but there was a 4.1 cm ascending aneurysm.  I saw him in the office again in October.  At that time the lung nodule and ascending aorta were unchanged.  Since his last visit, he has been feeling well.  He denies any chest pain, pressure, or tightness.  He denies shortness of breath.  His appetite is good.  He remains physically active.  Past Medical History:  Diagnosis Date  . Anxiety    with some things  . Arthritis   . BPH (benign prostatic hyperplasia)   . Chronic hepatitis C without hepatic coma ([place]) 08/16/2014   finished treatment 4-5 mos ago, CMET in [facility] 08-11-15  . H/O knee surgery 08/16/2014  . Hepatitis C   . Hyperlipidemia 08/16/2014  . IGT (impaired glucose tolerance)   . Right renal mass     Current Outpatient Medications  Medication Sig Dispense Refill  . atorvastatin (LIPITOR) 80 MG tablet   0  . lisinopril (PRINIVIL,ZESTRIL) 10 MG tablet   0  . metFORMIN (GLUCOPHAGE) 500 MG tablet   1   No current facility-administered medications for this visit.     Physical Exam BP 126/88 (BP Location: Left Arm, Patient Position: Sitting, Cuff Size: Normal)   Pulse 65   Resp 18   Ht 6\' 1"  (1.854 m)   Wt 170 lb (77.1 kg)   SpO2 98% Comment: RA  BMI 22.52 kg/m  62 year old man in no acute distress Alert and oriented x3 with no focal deficits No carotid bruits Cardiac regular rate and rhythm normal S1 and S2 no rubs or murmurs Lungs clear with equal  breath sounds bilaterally  Diagnostic Tests: CT CHEST WITHOUT CONTRAST  TECHNIQUE: Multidetector CT imaging of the chest was performed following the standard protocol without IV contrast.  COMPARISON:  CT 01/23/2017  FINDINGS: Cardiovascular: Ascending thoracic aorta measures 41 mm (image 71/2) in diameter measured at the level and angle as on CT 01/23/2017 which also measured 41 mm.  Coronary artery calcification and aortic atherosclerotic calcification.  Mediastinum/Nodes: No axillary supraclavicular adenopathy. No mediastinal adenopathy.  Lungs/Pleura: 7 mm LEFT lower lobe pulmonary nodule (image 98/8 unchanged.) No new nodularity  Upper Abdomen: Limited view of the liver, kidneys, pancreas are unremarkable. Normal adrenal glands.  Musculoskeletal: No aggressive osseous lesion.  IMPRESSION: 1. Stable ascending thoracic aortic diameter. 2. Stable LEFT lower lobe pulmonary nodule.   Electronically Signed   By: Suzy Bouchard M.D.   On: 01/28/2018 10:59 I personally reviewed the CT images and concur with the findings noted above  Impression: Adam [name] is a 62 year old gentleman with a past history of renal cell carcinoma, treated hepatitis C, hyperlipidemia, and arthritis.  He was incidentally found to have a left lower lobe lung nodule on a CT scan in 2017.  He also was noted to have a 4.1 cm ascending aneurysm.  He  now returns for follow-up of both of those issues.  Ascending aorta-thoracic aortic atherosclerosis.  Ascending aorta stable at 4.1 cm.  Needs continued annual follow-up.  Lung nodule-7 mm, unchanged over 2 years.  Consistent with benign nodule  Plan: Return in 1 year with CT chest to follow-up ascending aneurysm  Melrose Nakayama, MD [facility] [phone]

## 2018-02-13 MED FILL — ATORVASTATIN 80 MG TABLET: 80 | 90 days supply | Qty: 90 | Fill #0

## 2018-02-13 MED FILL — LISINOPRIL 10 MG TABS: 10 | 90 days supply | Qty: 90 | Fill #0

## 2018-02-13 MED FILL — metFORMIN HCL 500 MG TABS: 500 | 90 days supply | Qty: 180 | Fill #1

## 2018-02-21 DIAGNOSIS — E1169 Type 2 diabetes mellitus with other specified complication: Secondary | ICD-10-CM | POA: Diagnosis not present

## 2018-02-21 DIAGNOSIS — E7800 Pure hypercholesterolemia, unspecified: Secondary | ICD-10-CM | POA: Diagnosis not present

## 2018-06-04 MED FILL — LISINOPRIL 10 MG TABLET: 10 | 90 days supply | Qty: 90 | Fill #0

## 2018-06-04 MED FILL — ATORVASTATIN 80 MG TABLET: 80 | 90 days supply | Qty: 90 | Fill #0

## 2018-06-04 MED FILL — metFORMIN HCL 500 MG TABS: 500 | 90 days supply | Qty: 180 | Fill #0

## 2018-10-14 DIAGNOSIS — R972 Elevated prostate specific antigen [PSA]: Secondary | ICD-10-CM | POA: Diagnosis not present

## 2018-10-14 DIAGNOSIS — E7800 Pure hypercholesterolemia, unspecified: Secondary | ICD-10-CM | POA: Diagnosis not present

## 2018-10-14 DIAGNOSIS — Z0000 Encounter for general adult medical examination without abnormal findings: Secondary | ICD-10-CM | POA: Diagnosis not present

## 2018-10-14 DIAGNOSIS — Z7984 Long term (current) use of oral hypoglycemic drugs: Secondary | ICD-10-CM | POA: Diagnosis not present

## 2018-10-14 DIAGNOSIS — Z8619 Personal history of other infectious and parasitic diseases: Secondary | ICD-10-CM | POA: Diagnosis not present

## 2018-10-14 DIAGNOSIS — E1169 Type 2 diabetes mellitus with other specified complication: Secondary | ICD-10-CM | POA: Diagnosis not present

## 2018-10-14 DIAGNOSIS — Z85528 Personal history of other malignant neoplasm of kidney: Secondary | ICD-10-CM | POA: Diagnosis not present

## 2018-10-14 MED FILL — ATORVASTATIN 80 MG TABLET: 80 | 90 days supply | Qty: 90 | Fill #0

## 2018-10-14 MED FILL — metFORMIN HCL 500 MG TABS: 500 | 90 days supply | Qty: 180 | Fill #0

## 2018-10-14 MED FILL — LISINOPRIL 10 MG TABLET: 10 | 90 days supply | Qty: 90 | Fill #0

## 2018-10-21 DIAGNOSIS — E7800 Pure hypercholesterolemia, unspecified: Secondary | ICD-10-CM | POA: Diagnosis not present

## 2018-10-21 DIAGNOSIS — Z125 Encounter for screening for malignant neoplasm of prostate: Secondary | ICD-10-CM | POA: Diagnosis not present

## 2018-10-21 DIAGNOSIS — Z7984 Long term (current) use of oral hypoglycemic drugs: Secondary | ICD-10-CM | POA: Diagnosis not present

## 2018-10-21 DIAGNOSIS — E1169 Type 2 diabetes mellitus with other specified complication: Secondary | ICD-10-CM | POA: Diagnosis not present

## 2018-12-10 ENCOUNTER — Other Ambulatory Visit: Payer: Self-pay | Admitting: Thoracic Surgery (Cardiothoracic Vascular Surgery)

## 2018-12-10 DIAGNOSIS — I712 Thoracic aortic aneurysm, without rupture, unspecified: Secondary | ICD-10-CM

## 2019-01-22 ENCOUNTER — Ambulatory Visit
Admission: RE | Admit: 2019-01-22 | Discharge: 2019-01-22 | Disposition: A | Discharge status: Home / Self Care | Payer: 59 | Source: Ambulatory Visit | Attending: Thoracic Surgery (Cardiothoracic Vascular Surgery) | Admitting: Thoracic Surgery (Cardiothoracic Vascular Surgery)

## 2019-01-22 DIAGNOSIS — R911 Solitary pulmonary nodule: Secondary | ICD-10-CM | POA: Diagnosis not present

## 2019-01-22 DIAGNOSIS — I712 Thoracic aortic aneurysm, without rupture, unspecified: Secondary | ICD-10-CM

## 2019-01-27 ENCOUNTER — Encounter: Payer: Self-pay | Admitting: Thoracic Surgery (Cardiothoracic Vascular Surgery)

## 2019-01-27 ENCOUNTER — Other Ambulatory Visit: Payer: Self-pay

## 2019-01-27 ENCOUNTER — Ambulatory Visit: Payer: 59 | Admitting: Thoracic Surgery (Cardiothoracic Vascular Surgery)

## 2019-01-27 VITALS — BP 134/85 | HR 73 | Temp 97.7°F | Resp 16 | Ht 73.0 in | Wt 166.4 lb

## 2019-01-27 DIAGNOSIS — R911 Solitary pulmonary nodule: Secondary | ICD-10-CM

## 2019-01-27 DIAGNOSIS — I712 Thoracic aortic aneurysm, without rupture, unspecified: Secondary | ICD-10-CM

## 2019-01-27 NOTE — Progress Notes (Signed)
[address]       [place] [address]             [phone]     HPI: Adam [name] returns for a scheduled follow-up visit  Adam [name] is a 63 year old man with a past history of renal cell carcinoma, hepatitis C (treated), hyperlipidemia, arthritis, left lower lobe lung nodule, and 4.1 cm ascending aneurysm.  His lung nodule was first noted on a CT of the chest in 2017.  On a follow-up CT the lung nodules unchanged but is ascending aorta was noted to be 4.1 cm.  He has been followed since that time.    I last saw him in October 2019.  The lung nodule and aneurysm were both stable at that time.  In the interim since his last visit he broke his arm and had to have a rod placed he has got some nerve issues and limited range of motion his thumb related to that.  He has not been back to the orthopedist to discuss that.  He is not having any chest pain, pressure, tightness, or shortness of breath.  Past Medical History:  Diagnosis Date  . Anxiety    with some things  . Arthritis   . BPH (benign prostatic hyperplasia)   . Chronic hepatitis C without hepatic coma ([place]) 08/16/2014   finished treatment 4-5 mos ago, CMET in [facility] 08-11-15  . H/O knee surgery 08/16/2014  . Hepatitis C   . Hyperlipidemia 08/16/2014  . IGT (impaired glucose tolerance)   . Lung nodule    left lower lobe, unchanged in 3 years as of 01/2019  . Right renal mass     Current Outpatient Medications  Medication Sig Dispense Refill  . atorvastatin (LIPITOR) 80 MG tablet Take by mouth daily at 6 PM.   0  . lisinopril (PRINIVIL,ZESTRIL) 10 MG tablet Take 10 mg by mouth daily.   0  . metFORMIN (GLUCOPHAGE) 500 MG tablet Take 500 mg by mouth 2 (two) times daily with a meal. He only takes once daily  1   No current facility-administered medications for this visit.     Physical Exam BP 134/85 (BP Location: Right Arm, Patient Position: Sitting, Cuff Size: Normal)   Pulse 73   Temp 97.7 F (36.5  C)   Resp 16   Ht 6\' 1"  (1.854 m)   Wt 166 lb 6.4 oz (75.5 kg)   SpO2 97% Comment: RA  BMI 21.44 kg/m  63 year old man in no acute distress Alert and oriented x3. No carotid bruits Cardiac regular rate and rhythm normal S1 and S2, no murmur Lungs clear with equal breath sounds bilaterally No peripheral edema  Diagnostic Tests: CT CHEST WITHOUT CONTRAST  TECHNIQUE: Multidetector CT imaging of the chest was performed following the standard protocol without IV contrast.  COMPARISON:  01/28/2018, 01/23/2017, 06/15/2016  FINDINGS: Cardiovascular: Aortic atherosclerosis. Unchanged enlargement of the tubular ascending thoracic aorta measuring 4.1 x 4.1 cm. The sinuses of Valsalva measure up to 4.4 cm in caliber. The aortic valve measures up to 2.6 cm. The descending thoracic aorta measures 2.8 x 2.7 cm. Normal heart size. Scattered coronary artery calcifications. No pericardial effusion.  Mediastinum/Nodes: No enlarged mediastinal, hilar, or axillary lymph nodes. Thyroid gland, trachea, and esophagus demonstrate no significant findings.  Lungs/Pleura: Unchanged stable, benign 7 mm pulmonary nodule of the left lower lobe (series 9, image 110). There are innumerable redemonstrated tiny centrilobular pulmonary nodules, most numerous in the  bilateral apices. No pleural effusion or pneumothorax.  Upper Abdomen: No acute abnormality.  Musculoskeletal: No chest wall mass or suspicious bone lesions identified.  IMPRESSION: 1. Unchanged enlargement of the tubular ascending thoracic aorta measuring 4.1 x 4.1 cm. The sinuses of Valsalva measure up to 4.4 cm in caliber. The aortic valve measures up to 2.6 cm. The descending thoracic aorta measures 2.8 x 2.7 cm. Aortic Atherosclerosis (ICD10-I70.0).  2. Unchanged stable, benign 7 mm pulmonary nodule of the left lower lobe (series 9, image 110). No routine CT follow-up is required for this nodule.  3. There are  innumerable redemonstrated tiny centrilobular pulmonary nodules, most numerous in the bilateral apices, nonspecific and infectious or inflammatory, possibly smoking-related respiratory bronchiolitis.  4.  Coronary artery disease.   Electronically Signed   By: Eddie Candle M.D.   On: 01/22/2019 09:29 I personally reviewed the CT images and concur with the findings noted above  Impression: Adam [name] is a 63 year old man with a past medical history significant for hypertension, hyperlipidemia, renal cell carcinoma, treated hepatitis C, arthritis, left lower lobe lung nodule, and an ascending aneurysm.  Ascending aortic aneurysm-stable at 4.1 cm.  Needs continued annual follow-up.  Blood pressure control is a mainstay of treatment.  His blood pressures well controlled on his current medication.  Hypertension-blood pressure well controlled on the lisinopril  Coronary atherosclerosis-calcium noted in coronary arteries on CT.  No symptoms.  He is on a statin.  Lung nodule-stable for 3 years.  Consistent with benign nodule.  No additional follow-up needed.  Plan: Return in 1 year with CT chest to follow-up ascending aneurysm  Melrose Nakayama, MD [facility] [phone]

## 2019-01-29 MED FILL — ATORVASTATIN 80 MG TABLET: 80 | 90 days supply | Qty: 90 | Fill #1

## 2019-01-29 MED FILL — metFORMIN HCL 500 MG TABS: 500 | 90 days supply | Qty: 180 | Fill #1

## 2019-01-29 MED FILL — LISINOPRIL 10 MG TABS: 10 | 90 days supply | Qty: 90 | Fill #1

## 2019-02-16 DIAGNOSIS — H524 Presbyopia: Secondary | ICD-10-CM | POA: Diagnosis not present

## 2019-02-16 DIAGNOSIS — H2513 Age-related nuclear cataract, bilateral: Secondary | ICD-10-CM | POA: Diagnosis not present

## 2019-02-16 DIAGNOSIS — H5203 Hypermetropia, bilateral: Secondary | ICD-10-CM | POA: Diagnosis not present

## 2019-02-16 DIAGNOSIS — E119 Type 2 diabetes mellitus without complications: Secondary | ICD-10-CM | POA: Diagnosis not present

## 2019-04-16 DIAGNOSIS — E7800 Pure hypercholesterolemia, unspecified: Secondary | ICD-10-CM | POA: Diagnosis not present

## 2019-04-16 DIAGNOSIS — Z85528 Personal history of other malignant neoplasm of kidney: Secondary | ICD-10-CM | POA: Diagnosis not present

## 2019-04-16 DIAGNOSIS — E1169 Type 2 diabetes mellitus with other specified complication: Secondary | ICD-10-CM | POA: Diagnosis not present

## 2019-04-16 MED FILL — LISINOPRIL 10 MG TABS: 10 | 90 days supply | Qty: 90 | Fill #0

## 2019-04-16 MED FILL — metFORMIN HCL 500 MG TABS: 500 | 90 days supply | Qty: 180 | Fill #0

## 2019-04-16 MED FILL — ATORVASTATIN 80 MG TABLET: 80 | 90 days supply | Qty: 90 | Fill #0

## 2019-04-28 DIAGNOSIS — E7800 Pure hypercholesterolemia, unspecified: Secondary | ICD-10-CM | POA: Diagnosis not present

## 2019-04-28 DIAGNOSIS — Z7984 Long term (current) use of oral hypoglycemic drugs: Secondary | ICD-10-CM | POA: Diagnosis not present

## 2019-04-28 DIAGNOSIS — E1169 Type 2 diabetes mellitus with other specified complication: Secondary | ICD-10-CM | POA: Diagnosis not present

## 2019-05-05 MED FILL — metFORMIN HCL 500 MG TABS: 500 | 90 days supply | Qty: 180 | Fill #0

## 2019-05-05 MED FILL — LISINOPRIL 10 MG TABS: 10 | 90 days supply | Qty: 90 | Fill #0

## 2019-05-05 MED FILL — ATORVASTATIN 80 MG TABLET: 80 | 90 days supply | Qty: 90 | Fill #0

## 2019-05-08 MED FILL — ROSUVASTATIN CALCIUM 40 MG: 40 | 30 days supply | Qty: 30 | Fill #0

## 2019-06-11 MED FILL — ROSUVASTATIN CALCIUM 40 MG: 40 | 30 days supply | Qty: 30 | Fill #1

## 2019-07-21 MED FILL — ROSUVASTATIN CALCIUM 40 MG: 40 | 30 days supply | Qty: 30 | Fill #2

## 2019-08-07 DIAGNOSIS — E7800 Pure hypercholesterolemia, unspecified: Secondary | ICD-10-CM | POA: Diagnosis not present

## 2019-08-07 MED FILL — LISINOPRIL 10 MG TABS: 10 | 90 days supply | Qty: 90 | Fill #1

## 2019-08-07 MED FILL — METFORMIN HCL 500 MG TABS: 500 | 90 days supply | Qty: 180 | Fill #1

## 2019-10-22 MED FILL — ROSUVASTATIN CALCIUM 40 MG: 40 | 30 days supply | Qty: 30 | Fill #0

## 2019-10-26 DIAGNOSIS — E7800 Pure hypercholesterolemia, unspecified: Secondary | ICD-10-CM | POA: Diagnosis not present

## 2019-10-26 DIAGNOSIS — Z125 Encounter for screening for malignant neoplasm of prostate: Secondary | ICD-10-CM | POA: Diagnosis not present

## 2019-10-26 DIAGNOSIS — E1169 Type 2 diabetes mellitus with other specified complication: Secondary | ICD-10-CM | POA: Diagnosis not present

## 2019-10-26 DIAGNOSIS — F17200 Nicotine dependence, unspecified, uncomplicated: Secondary | ICD-10-CM | POA: Diagnosis not present

## 2019-10-26 DIAGNOSIS — Z8619 Personal history of other infectious and parasitic diseases: Secondary | ICD-10-CM | POA: Diagnosis not present

## 2019-10-26 DIAGNOSIS — I712 Thoracic aortic aneurysm, without rupture: Secondary | ICD-10-CM | POA: Diagnosis not present

## 2019-10-26 DIAGNOSIS — R911 Solitary pulmonary nodule: Secondary | ICD-10-CM | POA: Diagnosis not present

## 2019-10-26 DIAGNOSIS — Z85528 Personal history of other malignant neoplasm of kidney: Secondary | ICD-10-CM | POA: Diagnosis not present

## 2019-10-26 DIAGNOSIS — Z23 Encounter for immunization: Secondary | ICD-10-CM | POA: Diagnosis not present

## 2019-10-26 DIAGNOSIS — Z0000 Encounter for general adult medical examination without abnormal findings: Secondary | ICD-10-CM | POA: Diagnosis not present

## 2019-11-20 DIAGNOSIS — Z23 Encounter for immunization: Secondary | ICD-10-CM | POA: Diagnosis not present

## 2019-12-22 ENCOUNTER — Other Ambulatory Visit: Payer: Self-pay | Admitting: *Deleted

## 2019-12-22 DIAGNOSIS — I712 Thoracic aortic aneurysm, without rupture, unspecified: Secondary | ICD-10-CM

## 2019-12-30 MED FILL — METFORMIN HCL 500 MG TABS: 500 | 90 days supply | Qty: 180 | Fill #2

## 2019-12-31 ENCOUNTER — Other Ambulatory Visit (HOSPITAL_COMMUNITY): Payer: Self-pay | Admitting: Physician Assistant

## 2019-12-31 MED FILL — ROSUVASTATIN CALCIUM 40 MG: 40 | 30 days supply | Qty: 30 | Fill #0

## 2019-12-31 MED FILL — LISINOPRIL 10 MG TABS: 10 | 90 days supply | Qty: 90 | Fill #2

## 2020-01-19 ENCOUNTER — Ambulatory Visit
Admission: RE | Admit: 2020-01-19 | Discharge: 2020-01-19 | Disposition: A | Discharge status: Home / Self Care | Payer: 59 | Source: Ambulatory Visit | Attending: Thoracic Surgery (Cardiothoracic Vascular Surgery) | Admitting: Thoracic Surgery (Cardiothoracic Vascular Surgery)

## 2020-01-19 DIAGNOSIS — I712 Thoracic aortic aneurysm, without rupture, unspecified: Secondary | ICD-10-CM

## 2020-01-26 ENCOUNTER — Ambulatory Visit: Payer: 59 | Admitting: Thoracic Surgery (Cardiothoracic Vascular Surgery)

## 2020-01-26 ENCOUNTER — Encounter: Payer: Self-pay | Admitting: Thoracic Surgery (Cardiothoracic Vascular Surgery)

## 2020-01-26 ENCOUNTER — Other Ambulatory Visit: Payer: Self-pay

## 2020-01-26 VITALS — BP 149/97 | HR 62 | Resp 18 | Wt 172.0 lb

## 2020-01-26 DIAGNOSIS — I712 Thoracic aortic aneurysm, without rupture, unspecified: Secondary | ICD-10-CM

## 2020-01-26 NOTE — Progress Notes (Signed)
[address]       [place] [address]             [phone]     HPI: Adam Adam [name] returns for scheduled annual follow-up visit  Adam Adam [name] is a 64 year old Adam [name] with a past history of renal cell carcinoma, treated hepatitis C, hyperlipidemia, hypertension, arthritis, left lower lobe lung nodule, and a 4.1 cm ascending aneurysm.  He was found to have a lung nodule on a CT of the chest in 2017.  On follow-up the nodule was unchanged, but he was noted to have a 4.1 cm ascending aorta.  We have followed him since then.  I last saw him in October 2020.  Both the aneurysm and a lung nodule were stable.  The interim since his last visit he has been doing well.  He still works.  He says that his blood pressure was elevated on first check today because he just had a fight with his son.  He is not having any chest pain, pressure, tightness, or shortness of breath.  Past Medical History:  Diagnosis Date   Anxiety    with some things   Arthritis    BPH (benign prostatic hyperplasia)    Chronic hepatitis C without hepatic coma ([place]) 08/16/2014   finished treatment 4-5 mos ago, [facility] 08-11-15   H/O knee surgery 08/16/2014   Hepatitis C    Hyperlipidemia 08/16/2014   IGT (impaired glucose tolerance)    Lung nodule    left lower lobe, unchanged in 3 years as of 01/2019   Right renal mass     Current Outpatient Medications  Medication Sig Dispense Refill   atorvastatin (LIPITOR) 80 MG tablet Take by mouth daily at 6 PM.   0   lisinopril (PRINIVIL,ZESTRIL) 10 MG tablet Take 10 mg by mouth daily.   0   metFORMIN (GLUCOPHAGE) 500 MG tablet Take 500 mg by mouth 2 (two) times daily with a meal. He only takes once daily  1   No current facility-administered medications for this visit.    Physical Exam BP (!) 149/97 (BP Location: Left Arm, Patient Position: Sitting)    Pulse Adam    Resp 18 Comment: RA with mask on   Wt 172 lb (78 kg)    SpO2 95%    BMI  22.65 kg/m  Adam Adam [name] in no acute distress Alert and oriented x3 with no focal deficits No cervical or supraclavicular adenopathy, no carotid bruits Cardiac regular rate and rhythm with a normal S1 and S2 Lungs clear with equal breath sounds bilaterally No peripheral edema  Diagnostic Tests: CT CHEST WITHOUT CONTRAST  TECHNIQUE: Multidetector CT imaging of the chest was performed following the standard protocol without IV contrast.  COMPARISON:  Multiple previous CT scans. The most recent is 01/22/2019  FINDINGS: Cardiovascular: The heart is normal in size. No pericardial effusion. Stable fusiform aneurysmal dilatation of the ascending thoracic aorta with maximum measurement of 4.1 cm. Stable scattered atherosclerotic calcifications.  Stable three-vessel coronary artery calcifications.  Mediastinum/Nodes: No mediastinal or hilar mass or adenopathy. Stable scattered small lymph nodes, some of which demonstrate calcifications. The esophagus is grossly normal. Thyroid gland is grossly normal.  Lungs/Pleura: No acute pulmonary findings are identified.  Stable small centrilobular nodularity in the upper lung zones likely respiratory bronchiolitis changes. There is a stable 3 mm nodule in the left upper lobe on image 41/8  Stable 5.5 mm left lower lobe pulmonary nodule  on image 101/8. No new pulmonary lesions. No pleural effusion or pleural nodules.  Upper Abdomen: No significant upper abdominal findings. Stable vascular calcifications.  Musculoskeletal: No chest wall mass, supraclavicular or axillary adenopathy.  The bony thorax is intact.  IMPRESSION: 1. Stable fusiform aneurysmal dilatation of the ascending thoracic aorta with maximum measurement of 4.1 cm. 2. Stable three-vessel coronary artery calcifications. 3. Stable small centrilobular nodularity in the upper lung zones likely respiratory bronchiolitis changes. 4. Stable left lung nodules. No  new pulmonary lesions. 5. No mediastinal or hilar mass or adenopathy.  Aortic aneurysm NOS (ICD10-I71.9).   Electronically Signed   By: Marijo Sanes M.D.   On: 01/19/2020 14:33 I personally reviewed the CT images and concur with the findings noted above  Impression: Adam Adam [name] is a Adam Adam [name] with a past history of renal cell carcinoma, treated hepatitis C, hyperlipidemia, hypertension, arthritis, left lower lobe lung nodule, and a 4.1 cm ascending aneurysm.  Lung nodule-stable over 4 years.  Consistent with benign nodules  Ascending aneurysm-stable at 4.1 cm.  Needs continued annual follow-up.  Hypertension-blood pressure elevated today.  He thinks is due to some interpersonal stress.  Is taking lisinopril.  Does not check his pressure at home.  I emphasized the importance of blood pressure management for aneurysm as well as other health considerations.  Recommended that he check himself at home on a regular basis.  Goal systolic less than 220   Plan: Check blood pressure on a regular basis.  Goal systolic less than 254. Return in 1 year with CT chest  I spent 15 minutes today in review of records, images, and discussion with Adam Adam [name]. Melrose Nakayama, MD [facility] [phone]

## 2020-02-23 MED FILL — ROSUVASTATIN CALCIUM 40 MG: 40 | 90 days supply | Qty: 90 | Fill #1

## 2020-05-10 MED FILL — ROSUVASTATIN CALCIUM 40 MG: 40 | 30 days supply | Qty: 30 | Fill #2

## 2020-05-23 ENCOUNTER — Other Ambulatory Visit (HOSPITAL_COMMUNITY): Payer: Self-pay | Admitting: Physician Assistant

## 2020-05-23 MED FILL — METFORMIN HCL 500 MG TABS: 500 | 90 days supply | Qty: 180 | Fill #0

## 2020-05-23 MED FILL — LISINOPRIL 10 MG TABS: 10 | 90 days supply | Qty: 90 | Fill #0

## 2020-07-01 ENCOUNTER — Other Ambulatory Visit (HOSPITAL_COMMUNITY): Payer: Self-pay | Admitting: Physician Assistant

## 2020-07-01 MED FILL — ROSUVASTATIN CALCIUM 40 MG: 40 | 30 days supply | Qty: 30 | Fill #0

## 2020-07-02 ENCOUNTER — Other Ambulatory Visit (HOSPITAL_COMMUNITY): Payer: Self-pay

## 2020-07-04 ENCOUNTER — Other Ambulatory Visit (HOSPITAL_COMMUNITY): Payer: Self-pay

## 2020-08-30 ENCOUNTER — Other Ambulatory Visit (HOSPITAL_COMMUNITY): Payer: Self-pay

## 2020-08-30 MED FILL — Rosuvastatin Calcium Tab 40 MG: ORAL | 30 days supply | Qty: 30 | Fill #0 | Status: AC

## 2020-09-30 ENCOUNTER — Other Ambulatory Visit (HOSPITAL_COMMUNITY): Payer: Self-pay

## 2020-09-30 MED ORDER — METFORMIN HCL 500 MG PO TABS
ORAL_TABLET | ORAL | 1 refills | Status: DC
  Filled 2020-09-30: qty 180, 90d supply, fill #0
  Filled 2020-12-28: qty 180, 90d supply, fill #1

## 2020-09-30 MED ORDER — LISINOPRIL 10 MG PO TABS
ORAL_TABLET | ORAL | 1 refills | Status: DC
  Filled 2020-09-30: qty 90, 90d supply, fill #0
  Filled 2020-12-28: qty 90, 90d supply, fill #1

## 2020-10-05 ENCOUNTER — Other Ambulatory Visit (HOSPITAL_COMMUNITY): Payer: Self-pay

## 2020-11-15 ENCOUNTER — Other Ambulatory Visit (HOSPITAL_COMMUNITY): Payer: Self-pay

## 2020-11-15 MED FILL — Rosuvastatin Calcium Tab 40 MG: ORAL | 30 days supply | Qty: 30 | Fill #1 | Status: AC

## 2020-12-14 ENCOUNTER — Other Ambulatory Visit: Payer: Self-pay | Admitting: *Deleted

## 2020-12-14 DIAGNOSIS — I712 Thoracic aortic aneurysm, without rupture, unspecified: Secondary | ICD-10-CM

## 2020-12-14 NOTE — Progress Notes (Unsigned)
t

## 2020-12-28 ENCOUNTER — Other Ambulatory Visit (HOSPITAL_COMMUNITY): Payer: Self-pay

## 2021-01-02 ENCOUNTER — Other Ambulatory Visit (HOSPITAL_COMMUNITY): Payer: Self-pay

## 2021-01-02 MED ORDER — ROSUVASTATIN CALCIUM 40 MG PO TABS
40.0000 mg | ORAL_TABLET | Freq: Every day | ORAL | 2 refills | Status: DC
  Filled 2021-01-02: qty 30, 30d supply, fill #0
  Filled 2021-03-09: qty 30, 30d supply, fill #1
  Filled 2021-05-16: qty 30, 30d supply, fill #2

## 2021-01-09 ENCOUNTER — Other Ambulatory Visit (HOSPITAL_COMMUNITY): Payer: Self-pay

## 2021-01-19 ENCOUNTER — Ambulatory Visit
Admission: RE | Admit: 2021-01-19 | Discharge: 2021-01-19 | Disposition: A | Discharge status: Home / Self Care | Payer: 59 | Source: Ambulatory Visit | Attending: Thoracic Surgery (Cardiothoracic Vascular Surgery) | Admitting: Thoracic Surgery (Cardiothoracic Vascular Surgery)

## 2021-01-19 DIAGNOSIS — R918 Other nonspecific abnormal finding of lung field: Secondary | ICD-10-CM | POA: Diagnosis not present

## 2021-01-19 DIAGNOSIS — R911 Solitary pulmonary nodule: Secondary | ICD-10-CM | POA: Diagnosis not present

## 2021-01-19 DIAGNOSIS — I712 Thoracic aortic aneurysm, without rupture, unspecified: Secondary | ICD-10-CM

## 2021-01-19 DIAGNOSIS — J9811 Atelectasis: Secondary | ICD-10-CM | POA: Diagnosis not present

## 2021-01-24 ENCOUNTER — Ambulatory Visit: Payer: 59 | Admitting: Physician Assistant

## 2021-01-24 ENCOUNTER — Other Ambulatory Visit: Payer: Self-pay

## 2021-01-24 VITALS — BP 148/79 | HR 82 | Resp 20 | Wt 177.0 lb

## 2021-01-24 DIAGNOSIS — I7121 Aneurysm of the ascending aorta, without rupture: Secondary | ICD-10-CM

## 2021-01-24 NOTE — Progress Notes (Signed)
[address]       [place] [address]             [phone]     HPI: Adam [name] returns for scheduled annual follow-up visit  Adam [name] is a 65 year old man with a past history of renal cell carcinoma, treated hepatitis C, hyperlipidemia, hypertension, arthritis, left lower lobe lung nodule, and a 4.1 cm ascending aneurysm.  He was found to have a lung nodule on a CT of the chest in 2017.  On follow-up the nodule was unchanged, but he was noted to have a 4.1 cm ascending aorta.  We have followed him since then.  He last saw Dr. Roxan Hockey in October 2021.  Both the aneurysm and a lung nodule were stable at that time.  He still works. He was not having any chest pain, pressure, tightness, or shortness of breath.  He looks after his grandson and this keeps him very active.  He has not had any new medical issues occur over the last year.  Past Medical History:  Diagnosis Date   Anxiety    with some things   Arthritis    BPH (benign prostatic hyperplasia)    Chronic hepatitis C without hepatic coma ([place]) 08/16/2014   finished treatment 4-5 mos ago, [facility] 08-11-15   H/O knee surgery 08/16/2014   Hepatitis C    Hyperlipidemia 08/16/2014   IGT (impaired glucose tolerance)    Lung nodule    left lower lobe, unchanged in 3 years as of 01/2019   Right renal mass     Current Outpatient Medications  Medication Sig Dispense Refill   atorvastatin (LIPITOR) 80 MG tablet Take by mouth daily at 6 PM.   0   lisinopril (PRINIVIL,ZESTRIL) 10 MG tablet Take 10 mg by mouth daily.   0   metFORMIN (GLUCOPHAGE) 500 MG tablet Take 500 mg by mouth 2 (two) times daily with a meal. He only takes once daily  1   No current facility-administered medications for this visit.   Vitals:   01/24/21 1337  BP: (!) 148/79  Pulse: 82  Resp: 20  SpO2: 96%    Physical Exam  65 year old man in no acute distress  Alert and oriented x3 with no focal deficits  Cardiac  regular rate and rhythm with a normal S1 and S2 Lungs clear with equal breath sounds bilaterally No abdominal tenderness No peripheral edema  Diagnostic Tests:  CLINICAL DATA:  A 65 year old male presents for thoracic aortic aneurysm follow-up.   EXAM: CT CHEST WITHOUT CONTRAST   TECHNIQUE: Multidetector CT imaging of the chest was performed following the standard protocol without IV contrast.   COMPARISON:  Comparison is made with January 19, 2020.   FINDINGS: Cardiovascular: Calcified atheromatous plaque of the thoracic aorta. Three-vessel coronary artery disease. Normal heart size without substantial pericardial effusion. Normal caliber of the central pulmonary vasculature. Limited assessment of cardiovascular structures given lack of intravenous contrast.   Ascending thoracic aorta dilated to 4.1 cm axial dimension and 4 cm as measured in the coronal plane. Descending thoracic aorta is normal caliber at 2.7 cm.   Mediastinum/Nodes: No thoracic inlet, axillary, mediastinal or hilar adenopathy. Esophagus grossly normal.   Lungs/Pleura: Basilar atelectasis. No effusion or consolidative process. Airways are patent. Stable tiny 2 mm nodule in the RIGHT upper lobe (image 32/100)   Stable mildly irregular nodule in the LEFT lower lobe measuring 5 mm (image 99/8)   Stable 3  mm nodule in the LEFT upper lobe (image 39/8)   Upper Abdomen: Incidental imaging of upper abdominal contents without acute process. Imaged portions of the liver, pancreas, spleen, adrenal glands and kidneys are unremarkable. Gastrointestinal tract with very limited assessment also unremarkable.   Musculoskeletal: Spinal degenerative changes without acute or destructive bone process.   IMPRESSION: Ascending thoracic aorta dilated to 4.1 cm axial dimension and 4 cm as measured in the coronal plane. This is unchanged. Recommend annual imaging followup by CTA or MRA. This recommendation follows 2010  ACCF/AHA/AATS/ACR/ASA/SCA/SCAI/SIR/STS/SVM Guidelines for the Diagnosis and Management of Patients with Thoracic Aortic Disease. Circulation. 2010; 121: M353-I144. Aortic aneurysm NOS (ICD10-I71.9)   Three-vessel coronary artery disease.   Stable small bilateral pulmonary nodules. Compatible with benign pulmonary nodules.     Electronically Signed   By: Zetta Bills M.D.   On: 01/19/2021 12:10  CT CHEST WITHOUT CONTRAST   TECHNIQUE: Multidetector CT imaging of the chest was performed following the standard protocol without IV contrast.   COMPARISON:  Multiple previous CT scans. The most recent is 01/22/2019   FINDINGS: Cardiovascular: The heart is normal in size. No pericardial effusion. Stable fusiform aneurysmal dilatation of the ascending thoracic aorta with maximum measurement of 4.1 cm. Stable scattered atherosclerotic calcifications.   Stable three-vessel coronary artery calcifications.   Mediastinum/Nodes: No mediastinal or hilar mass or adenopathy. Stable scattered small lymph nodes, some of which demonstrate calcifications. The esophagus is grossly normal. Thyroid gland is grossly normal.   Lungs/Pleura: No acute pulmonary findings are identified.   Stable small centrilobular nodularity in the upper lung zones likely respiratory bronchiolitis changes. There is a stable 3 mm nodule in the left upper lobe on image 41/8   Stable 5.5 mm left lower lobe pulmonary nodule on image 101/8. No new pulmonary lesions. No pleural effusion or pleural nodules.   Upper Abdomen: No significant upper abdominal findings. Stable vascular calcifications.   Musculoskeletal: No chest wall mass, supraclavicular or axillary adenopathy.   The bony thorax is intact.   IMPRESSION: 1. Stable fusiform aneurysmal dilatation of the ascending thoracic aorta with maximum measurement of 4.1 cm. 2. Stable three-vessel coronary artery calcifications. 3. Stable small centrilobular  nodularity in the upper lung zones likely respiratory bronchiolitis changes. 4. Stable left lung nodules. No new pulmonary lesions. 5. No mediastinal or hilar mass or adenopathy.   Aortic aneurysm NOS (ICD10-I71.9).     Electronically Signed   By: Marijo Sanes M.D.   On: 01/19/2020 14:33   Impression:  Adam [name] is a 65 year old man with a past history of renal cell carcinoma, treated hepatitis C, hyperlipidemia, hypertension, arthritis, left lower lobe lung nodule, and a 4.1 cm ascending aneurysm.  Lung nodule-stable over 4 years.  Consistent with benign nodules.  No new nodules or growth.  Ascending aneurysm-stable at 4.1 cm.  Needs continued annual follow-up.  Hypertension-blood pressure elevated today.  He does have a way to take it at home.  I emphasized the importance of blood pressure management for aneurysm as well as other health considerations.  Goal systolic less than 315.    Plan: Check blood pressure on a regular basis.  Goal systolic less than 400. Return in 1 year with CT chest noncontrast.  I spent 20 minutes today in review of records, images, and discussion with Adam [name]. Nicholes Rough, PA-C [facility] [phone]

## 2021-03-09 ENCOUNTER — Other Ambulatory Visit (HOSPITAL_COMMUNITY): Payer: Self-pay

## 2021-05-16 ENCOUNTER — Other Ambulatory Visit (HOSPITAL_COMMUNITY): Payer: Self-pay

## 2021-06-23 ENCOUNTER — Other Ambulatory Visit (HOSPITAL_COMMUNITY): Payer: Self-pay

## 2021-06-23 MED ORDER — ROSUVASTATIN CALCIUM 40 MG PO TABS
40.0000 mg | ORAL_TABLET | Freq: Every day | ORAL | 0 refills | Status: DC
  Filled 2021-06-23: qty 30, 30d supply, fill #0

## 2021-06-23 MED ORDER — LISINOPRIL 10 MG PO TABS
ORAL_TABLET | ORAL | 0 refills | Status: DC
  Filled 2021-06-23: qty 90, 90d supply, fill #0

## 2021-06-23 MED ORDER — METFORMIN HCL 500 MG PO TABS
ORAL_TABLET | ORAL | 0 refills | Status: DC
  Filled 2021-06-23: qty 180, 90d supply, fill #0

## 2021-07-12 ENCOUNTER — Other Ambulatory Visit (HOSPITAL_COMMUNITY): Payer: Self-pay

## 2021-07-12 MED ORDER — ROSUVASTATIN CALCIUM 40 MG PO TABS
40.0000 mg | ORAL_TABLET | Freq: Every day | ORAL | 0 refills | Status: DC
  Filled 2021-07-12 – 2021-09-04 (×2): qty 30, 30d supply, fill #0

## 2021-07-13 ENCOUNTER — Other Ambulatory Visit (HOSPITAL_COMMUNITY): Payer: Self-pay

## 2021-07-19 ENCOUNTER — Other Ambulatory Visit (HOSPITAL_COMMUNITY): Payer: Self-pay

## 2021-07-27 ENCOUNTER — Other Ambulatory Visit (HOSPITAL_COMMUNITY): Payer: Self-pay

## 2021-09-04 ENCOUNTER — Other Ambulatory Visit (HOSPITAL_COMMUNITY): Payer: Self-pay

## 2021-09-19 ENCOUNTER — Other Ambulatory Visit (HOSPITAL_COMMUNITY): Payer: Self-pay

## 2021-09-22 ENCOUNTER — Other Ambulatory Visit (HOSPITAL_COMMUNITY): Payer: Self-pay

## 2021-09-22 DIAGNOSIS — E7800 Pure hypercholesterolemia, unspecified: Secondary | ICD-10-CM | POA: Diagnosis not present

## 2021-09-22 DIAGNOSIS — E1169 Type 2 diabetes mellitus with other specified complication: Secondary | ICD-10-CM | POA: Diagnosis not present

## 2021-09-22 DIAGNOSIS — F17200 Nicotine dependence, unspecified, uncomplicated: Secondary | ICD-10-CM | POA: Diagnosis not present

## 2021-09-22 DIAGNOSIS — I700 Atherosclerosis of aorta: Secondary | ICD-10-CM | POA: Diagnosis not present

## 2021-09-27 DIAGNOSIS — H52223 Regular astigmatism, bilateral: Secondary | ICD-10-CM | POA: Diagnosis not present

## 2021-09-27 DIAGNOSIS — H524 Presbyopia: Secondary | ICD-10-CM | POA: Diagnosis not present

## 2021-09-27 DIAGNOSIS — H5203 Hypermetropia, bilateral: Secondary | ICD-10-CM | POA: Diagnosis not present

## 2021-09-28 ENCOUNTER — Other Ambulatory Visit (HOSPITAL_COMMUNITY): Payer: Self-pay

## 2021-10-19 ENCOUNTER — Other Ambulatory Visit (HOSPITAL_COMMUNITY): Payer: Self-pay

## 2021-10-19 MED ORDER — LISINOPRIL 10 MG PO TABS
ORAL_TABLET | ORAL | 1 refills | Status: DC
  Filled 2021-10-19: qty 90, 90d supply, fill #0
  Filled 2022-04-12: qty 90, 90d supply, fill #1

## 2021-10-19 MED ORDER — METFORMIN HCL 500 MG PO TABS
ORAL_TABLET | ORAL | 1 refills | Status: DC
  Filled 2021-10-19: qty 180, 90d supply, fill #0
  Filled 2022-04-12: qty 180, 90d supply, fill #1

## 2021-10-19 MED ORDER — ROSUVASTATIN CALCIUM 40 MG PO TABS
ORAL_TABLET | ORAL | 1 refills | Status: DC
  Filled 2021-10-19: qty 90, 90d supply, fill #0
  Filled 2022-02-20: qty 90, 90d supply, fill #1

## 2021-10-20 ENCOUNTER — Other Ambulatory Visit (HOSPITAL_COMMUNITY): Payer: Self-pay

## 2021-10-20 MED ORDER — METFORMIN HCL 500 MG PO TABS
500.0000 mg | ORAL_TABLET | Freq: Two times a day (BID) | ORAL | 0 refills | Status: DC
  Filled 2021-10-20 – 2022-01-04 (×2): qty 180, 90d supply, fill #0

## 2021-10-20 MED ORDER — LISINOPRIL 10 MG PO TABS
10.0000 mg | ORAL_TABLET | Freq: Every day | ORAL | 0 refills | Status: DC
  Filled 2021-10-20 – 2022-01-04 (×2): qty 90, 90d supply, fill #0

## 2021-10-20 MED ORDER — ROSUVASTATIN CALCIUM 40 MG PO TABS
40.0000 mg | ORAL_TABLET | Freq: Every day | ORAL | 0 refills | Status: DC
  Filled 2021-10-20 – 2022-01-04 (×2): qty 30, 30d supply, fill #0

## 2021-12-07 ENCOUNTER — Other Ambulatory Visit: Payer: Self-pay | Admitting: *Deleted

## 2021-12-07 DIAGNOSIS — I7121 Aneurysm of the ascending aorta, without rupture: Secondary | ICD-10-CM

## 2021-12-07 DIAGNOSIS — R918 Other nonspecific abnormal finding of lung field: Secondary | ICD-10-CM

## 2022-01-04 ENCOUNTER — Other Ambulatory Visit (HOSPITAL_COMMUNITY): Payer: Self-pay

## 2022-01-25 ENCOUNTER — Ambulatory Visit
Admission: RE | Admit: 2022-01-25 | Discharge: 2022-01-25 | Disposition: A | Discharge status: Home / Self Care | Payer: 59 | Source: Ambulatory Visit | Attending: Thoracic Surgery (Cardiothoracic Vascular Surgery) | Admitting: Thoracic Surgery (Cardiothoracic Vascular Surgery)

## 2022-01-25 DIAGNOSIS — R918 Other nonspecific abnormal finding of lung field: Secondary | ICD-10-CM

## 2022-01-25 DIAGNOSIS — I7121 Aneurysm of the ascending aorta, without rupture: Secondary | ICD-10-CM

## 2022-01-25 DIAGNOSIS — I700 Atherosclerosis of aorta: Secondary | ICD-10-CM | POA: Diagnosis not present

## 2022-01-25 DIAGNOSIS — R911 Solitary pulmonary nodule: Secondary | ICD-10-CM | POA: Diagnosis not present

## 2022-01-30 ENCOUNTER — Ambulatory Visit: Payer: 59 | Admitting: Surgical

## 2022-01-30 VITALS — BP 166/96 | HR 91 | Resp 20 | Wt 165.0 lb

## 2022-01-30 DIAGNOSIS — I7121 Aneurysm of the ascending aorta, without rupture: Secondary | ICD-10-CM

## 2022-01-30 NOTE — Progress Notes (Signed)
Subjective:     Patient ID: Adam [name], male    DOB: [birth year], 66 y.o.   MRN: [id number]  Chief Complaint  Patient presents with   Thoracic Aortic Aneurysm    Yearly f/u with chest CT    HPI Patient is in today for an CT surgical follow-up for ascending aortic aneurysm.  This study last year was 4.1 cm axial dimension and 4 cm measured at the coronal plane.  This was unchanged at that time.  Today's exam report is below and again it is noted to be unchanged.  There has been additional findings of coronary artery disease on the scans and the patient has never seen a cardiologist.  He denies chest pain.  He has known hypertension which appears to be fairly poorly controlled although the patient states at home measurements are much better and that he suffers from "white coat syndrome."  There are no other significant symptoms in regards to his cardiac exam and overall he feels well.  He walks frequently and hunts for leisure sport.  Past Medical History:  Diagnosis Date   Anxiety    with some things   Arthritis    BPH (benign prostatic hyperplasia)    Chronic hepatitis C without hepatic coma ([place]) 08/16/2014   finished treatment 4-5 mos ago, CMET in EPIC 08-11-15   H/O knee surgery 08/16/2014   Hepatitis C    Hyperlipidemia 08/16/2014   IGT (impaired glucose tolerance)    Lung nodule    left lower lobe, unchanged in 3 years as of 01/2019   Right renal mass     Patient Active Problem List   Diagnosis Date Noted   Renal mass 01/27/2016   Acute cholecystitis 08/12/2015   Chronic hepatitis C without hepatic coma ([place]) 08/16/2014   HTN (hypertension) 08/16/2014   H/O knee surgery 08/16/2014   Hyperlipidemia 08/16/2014     Current Outpatient Medications  Medication Instructions   atorvastatin (LIPITOR) 80 MG tablet Oral, Daily-1800   lisinopril (ZESTRIL) 10 MG tablet Take 1 tablet by mouth Once a day   lisinopril (ZESTRIL) 10 MG tablet TAKE 1 TABLET BY MOUTH ONCE A DAY  FOR KIDNEY PROTECTION   metFORMIN (GLUCOPHAGE) 500 MG tablet Take 1 tablet by mouth twice a day   metFORMIN (GLUCOPHAGE) 500 MG tablet TAKE 1 TABLET BY MOUTH TWICE A DAY WITH MEALS FOR DIABETES.   rosuvastatin (CRESTOR) 40 MG tablet Take 1 tablet by mouth Once a day   rosuvastatin (CRESTOR) 40 MG tablet TAKE 1 TABLET BY MOUTH DAILY     Social History   Occupational History   Not on file  Tobacco Use   Smoking status: Never   Smokeless tobacco: Never  Substance and Sexual Activity   Alcohol use: No   Drug use: Yes    Types: Marijuana    Comment: last joint 01/24/16   Sexual activity: Yes    Narrative & Impression  CLINICAL DATA:  Follow-up TAA   EXAM: CT CHEST WITHOUT CONTRAST   TECHNIQUE: Multidetector CT imaging of the chest was performed following the standard protocol without IV contrast.   RADIATION DOSE REDUCTION: This exam was performed according to the departmental dose-optimization program which includes automated exposure control, adjustment of the mA and/or kV according to patient size and/or use of iterative reconstruction technique.   COMPARISON:  01/19/2021   FINDINGS: Cardiovascular: Aortic atherosclerosis. Unchanged enlargement of the tubular ascending thoracic aorta, measuring 4.0 x 4.0 cm. Although evaluation is limited  on noncontrast CT, the aortic valve measures approximately 2.5 cm in the sinuses of Valsalva 3.5 cm. Normal caliber of the arch and descending thoracic aorta, measuring up to 2.8 x 2.8 cm. Normal heart size. Left and right coronary artery calcifications. No pericardial effusion.   Mediastinum/Nodes: No enlarged mediastinal, hilar, or axillary lymph nodes. Thyroid gland, trachea, and esophagus demonstrate no significant findings.   Lungs/Pleura: Unchanged, definitively benign occasional small pulmonary nodules, largest in the peripheral left lower lobe measuring 0.5 cm (series 8, image 94). No further follow-up or characterization  is required for these benign nodules. Background of very fine centrilobular pulmonary nodules, most concentrated in the lung apices. No pleural effusion or pneumothorax.   Upper Abdomen: No acute abnormality.   Musculoskeletal: No chest wall abnormality. No acute osseous findings.   IMPRESSION: 1. Unchanged enlargement of the tubular ascending thoracic aorta, measuring 4.0 x 4.0 cm. Recommend annual imaging followup by CTA or MRA. This recommendation follows 2010 ACCF/AHA/AATS/ACR/ASA/SCA/SCAI/SIR/STS/SVM Guidelines for the Diagnosis and Management of Patients with Thoracic Aortic Disease. Circulation. 2010; 121: Q469-G295. Aortic aneurysm NOS (ICD10-I71.9) 2. Background of very fine centrilobular pulmonary nodules, most concentrated in the lung apices, most commonly seen in smoking-related respiratory bronchiolitis. 3. Coronary artery disease.   Aortic Atherosclerosis (ICD10-I70.0).     Electronically Signed   By: Delanna Ahmadi M.D.   On: 01/25/2022 10:24         Objective:    There were no vitals taken for this visit. BP Readings from Last 3 Encounters:  01/30/22 (!) 166/96  01/24/21 (!) 148/79  01/26/20 (!) 149/97   Wt Readings from Last 3 Encounters:  01/30/22 165 lb (74.8 kg)  01/24/21 177 lb (80.3 kg)  01/26/20 172 lb (78 kg)    Physical Exam Constitutional:      General: He is not in acute distress.    Appearance: He is normal weight.  HENT:     Head: Normocephalic and atraumatic.  Neck:     Vascular: No carotid bruit.  Cardiovascular:     Rate and Rhythm: Normal rate and regular rhythm.     Pulses: Normal pulses.     Heart sounds: No murmur heard.    No gallop.  Pulmonary:     Effort: Pulmonary effort is normal.     Breath sounds: Normal breath sounds.  Abdominal:     General: Abdomen is flat.  Musculoskeletal:        General: No swelling.     Right lower leg: No edema.     Left lower leg: No edema.  Lymphadenopathy:     Cervical: No  cervical adenopathy.  Skin:    General: Skin is warm and dry.     Capillary Refill: Capillary refill takes less than 2 seconds.     Coloration: Skin is pale.  Neurological:     General: No focal deficit present.     Mental Status: He is alert and oriented to person, place, and time.  Psychiatric:        Mood and Affect: Mood normal.        Behavior: Behavior normal.     No results found for any visits on 01/30/22.      Assessment & Plan:   The patient's thoracic aneurysm remains stable in size.  He remains asymptomatic in this regard.  It does not appear that he is ever seen cardiology so I think it would be reasonable for him to see them for possible baseline stress testing with  multivessel coronary artery disease by scan.  Additionally although he feels like his blood pressure is typically well controlled there have been several recent readings at or in the hypertensive range.  He is only on lisinopril 10 mg daily.  He is resistant to taking more medication or higher dosing but did agree that it would be reasonable to keep a blood pressure diary with dates and times at least taking twice daily to provide data to his primary care provider.  We will see the patient again in 1 year with a repeat CTA of the chest for ongoing surveillance.     Problem List Items Addressed This Visit   None   No orders of the defined types were placed in this encounter.   No follow-ups on file.  John Giovanni, PA-C

## 2022-01-30 NOTE — Patient Instructions (Signed)
Take blood pressure at least twice daily and record dates and times. Encourage increase baseline exercise and eat nutritious meals.

## 2022-02-20 ENCOUNTER — Other Ambulatory Visit (HOSPITAL_COMMUNITY): Payer: Self-pay

## 2022-02-26 ENCOUNTER — Other Ambulatory Visit (HOSPITAL_COMMUNITY): Payer: Self-pay

## 2022-03-27 DIAGNOSIS — Z85528 Personal history of other malignant neoplasm of kidney: Secondary | ICD-10-CM | POA: Diagnosis not present

## 2022-03-27 DIAGNOSIS — I700 Atherosclerosis of aorta: Secondary | ICD-10-CM | POA: Diagnosis not present

## 2022-03-27 DIAGNOSIS — Z125 Encounter for screening for malignant neoplasm of prostate: Secondary | ICD-10-CM | POA: Diagnosis not present

## 2022-03-27 DIAGNOSIS — E7800 Pure hypercholesterolemia, unspecified: Secondary | ICD-10-CM | POA: Diagnosis not present

## 2022-03-27 DIAGNOSIS — R972 Elevated prostate specific antigen [PSA]: Secondary | ICD-10-CM | POA: Diagnosis not present

## 2022-03-27 DIAGNOSIS — R911 Solitary pulmonary nodule: Secondary | ICD-10-CM | POA: Diagnosis not present

## 2022-03-27 DIAGNOSIS — E1169 Type 2 diabetes mellitus with other specified complication: Secondary | ICD-10-CM | POA: Diagnosis not present

## 2022-03-27 DIAGNOSIS — I712 Thoracic aortic aneurysm, without rupture, unspecified: Secondary | ICD-10-CM | POA: Diagnosis not present

## 2022-03-27 DIAGNOSIS — Z0000 Encounter for general adult medical examination without abnormal findings: Secondary | ICD-10-CM | POA: Diagnosis not present

## 2022-04-12 ENCOUNTER — Other Ambulatory Visit (HOSPITAL_COMMUNITY): Payer: Self-pay

## 2022-04-12 MED ORDER — ROSUVASTATIN CALCIUM 40 MG PO TABS
40.0000 mg | ORAL_TABLET | Freq: Every day | ORAL | 5 refills | Status: DC
  Filled 2022-04-12 – 2022-05-17 (×2): qty 30, 30d supply, fill #0
  Filled 2022-09-24: qty 30, 30d supply, fill #1
  Filled 2022-12-04: qty 30, 30d supply, fill #2
  Filled 2023-01-21: qty 30, 30d supply, fill #3
  Filled 2023-02-25: qty 30, 30d supply, fill #4
  Filled 2023-03-25: qty 30, 30d supply, fill #5

## 2022-05-17 ENCOUNTER — Other Ambulatory Visit (HOSPITAL_COMMUNITY): Payer: Self-pay

## 2022-05-31 DIAGNOSIS — Z1211 Encounter for screening for malignant neoplasm of colon: Secondary | ICD-10-CM | POA: Diagnosis not present

## 2022-05-31 DIAGNOSIS — K582 Mixed irritable bowel syndrome: Secondary | ICD-10-CM | POA: Diagnosis not present

## 2022-05-31 DIAGNOSIS — E782 Mixed hyperlipidemia: Secondary | ICD-10-CM | POA: Diagnosis not present

## 2022-05-31 DIAGNOSIS — I10 Essential (primary) hypertension: Secondary | ICD-10-CM | POA: Diagnosis not present

## 2022-05-31 DIAGNOSIS — Z86010 Personal history of colonic polyps: Secondary | ICD-10-CM | POA: Diagnosis not present

## 2022-06-28 ENCOUNTER — Other Ambulatory Visit (HOSPITAL_COMMUNITY): Payer: Self-pay

## 2022-08-10 ENCOUNTER — Ambulatory Visit: Payer: Commercial Managed Care - PPO | Attending: Cardiovascular Disease | Admitting: Cardiovascular Disease

## 2022-08-10 ENCOUNTER — Encounter: Payer: Self-pay | Admitting: Cardiovascular Disease

## 2022-08-10 VITALS — BP 122/82 | HR 75 | Ht 73.0 in | Wt 174.0 lb

## 2022-08-10 DIAGNOSIS — I2510 Atherosclerotic heart disease of native coronary artery without angina pectoris: Secondary | ICD-10-CM | POA: Insufficient documentation

## 2022-08-10 DIAGNOSIS — E782 Mixed hyperlipidemia: Secondary | ICD-10-CM

## 2022-08-10 DIAGNOSIS — Z8249 Family history of ischemic heart disease and other diseases of the circulatory system: Secondary | ICD-10-CM | POA: Diagnosis not present

## 2022-08-10 DIAGNOSIS — I712 Thoracic aortic aneurysm, without rupture, unspecified: Secondary | ICD-10-CM | POA: Insufficient documentation

## 2022-08-10 DIAGNOSIS — I10 Essential (primary) hypertension: Secondary | ICD-10-CM | POA: Diagnosis not present

## 2022-08-10 NOTE — Assessment & Plan Note (Signed)
Chest CT performed 01/25/2022 showed coronary calcification.  I am going get a coronary calcium score to further quantify.

## 2022-08-10 NOTE — Assessment & Plan Note (Signed)
History of ascending thoracic aortic aneurysm measuring 41 mm by CTA last performed 01/25/2022 followed by Dr. Dorris Fetch.

## 2022-08-10 NOTE — Assessment & Plan Note (Signed)
Mother died of a myocardial infarction at age 68.  Brother had a stent.

## 2022-08-10 NOTE — Progress Notes (Signed)
08/10/2022 Adam [name]   1955/12/08  [id number]  Primary Physician Milus Height, PA Primary Cardiologist: Runell Gess MD [name]  HPI:  Adam [name] is a 67 y.o. 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  He was referred to me by Adam Mull, PA-C for cardiovascular dilation because of risk factors.  He smoked remotely but has smoked marijuana for the last 55 years.  He has risk factors that include treated hypertension, diabetes and hyperlipidemia.  His mother died of a myocardial infarction at age 12 and his brother had coronary stents.  He is never had a heart attack or stroke.  He denies chest pain or shortness of breath.  He is fairly active, hunts, fishes and raises chickens.  He has had a history of renal cell cancer status post nephrectomy.  He does have a small ascending thoracic aortic aneurysm measuring 41 mm by CTA performed 01/25/2022 which also incidentally noted coronary calcification in the left and right coronary systems.  This is followed by Dr. Dorris [name] as an outpatient.   Current Meds  Medication Sig   atorvastatin (LIPITOR) 80 MG tablet Take by mouth daily at 6 PM.   lisinopril (ZESTRIL) 10 MG tablet Take 1 tablet by mouth Once a day   metFORMIN (GLUCOPHAGE) 500 MG tablet Take 1 tablet by mouth twice a day   rosuvastatin (CRESTOR) 40 MG tablet Take 1 tablet by mouth Once a day     No Known Allergies  Social History   Socioeconomic History   Marital status: Married    Spouse name: Not on file   Number of children: Not on file   Years of education: Not on file   Highest education level: Not on file  Occupational History   Not on file  Tobacco Use   Smoking status: Never   Smokeless tobacco: Never  Substance and Sexual Activity   Alcohol use: No   Drug use: Yes    Types: Marijuana    Comment: last joint 01/24/16   Sexual activity: Yes   Other Topics Concern   Not on file  Social History Narrative   Not on file   Social Determinants of Health   Financial Resource Strain: Not on file  Food Insecurity: Not on file  Transportation Needs: Not on file  Physical Activity: Not on file  Stress: Not on file  Social Connections: Not on file  Intimate Partner Violence: Not on file     Review of Systems: General: negative for chills, fever, night sweats or weight changes.  Cardiovascular: negative for chest pain, dyspnea on exertion, edema, orthopnea, palpitations, paroxysmal nocturnal dyspnea or shortness of breath Dermatological: negative for rash Respiratory: negative for cough or wheezing Urologic: negative for hematuria Abdominal: negative for nausea, vomiting, diarrhea, bright red blood per rectum, melena, or hematemesis Neurologic: negative for visual changes, syncope, or dizziness All other systems reviewed and are otherwise negative except as noted above.    Blood pressure 122/82, pulse 75, height 6\' 1"  (1.854 m), weight 174 lb (78.9 kg), SpO2 97 %.  General appearance: alert and no distress Neck: no adenopathy, no carotid bruit, no JVD, supple, symmetrical, trachea midline, and thyroid not enlarged, symmetric, no tenderness/mass/nodules Lungs: clear to auscultation bilaterally Heart: regular rate and rhythm, S1, S2 normal, no murmur, click, rub or gallop Extremities: extremities normal, atraumatic, no cyanosis or edema Pulses: 2+ and symmetric Skin: Skin  color, texture, turgor normal. No rashes or lesions Neurologic: Grossly normal  EKG sinus rhythm at 75 without ST or T wave changes.  I personally reviewed this EKG.  ASSESSMENT AND PLAN:   HTN (hypertension) His essential hypertension blood pressure measured today 122/82.  He is on lisinopril.  Hyperlipidemia History of hyperlipidemia on high-dose rosuvastatin with lipid profile performed 09/22/2021 revealing total cholesterol 107, LDL 52 and HDL  28.  Thoracic aortic aneurysm [facility]) History of ascending thoracic aortic aneurysm measuring 41 mm by CTA last performed 01/25/2022 followed by Dr. Dorris [name].  Coronary artery calcification Chest CT performed 01/25/2022 showed coronary calcification.  I am going get a coronary calcium score to further quantify.  Family history of heart disease Mother died of a myocardial infarction at age 39.  Brother had a stent.     Runell Gess MD FACP,FACC,FAHA, Adam [name] 08/10/2022 9:12 AM

## 2022-08-10 NOTE — Assessment & Plan Note (Signed)
His essential hypertension blood pressure measured today 122/82.  He is on lisinopril.

## 2022-08-10 NOTE — Assessment & Plan Note (Signed)
History of hyperlipidemia on high-dose rosuvastatin with lipid profile performed 09/22/2021 revealing total cholesterol 107, LDL 52 and HDL 28.

## 2022-08-10 NOTE — Patient Instructions (Signed)
Medication Instructions:  Your physician recommends that you continue on your current medications as directed. Please refer to the Current Medication list given to you today.  *If you need a refill on your cardiac medications before your next appointment, please call your pharmacy*   Testing/Procedures: Dr. Allyson Sabal has ordered a CT coronary calcium score.   Test locations:  [facility]  [facility]  This is $99 out of pocket.   Coronary CalciumScan A coronary calcium scan is an imaging test used to look for deposits of calcium and other fatty materials (plaques) in the inner lining of the blood vessels of the heart (coronary arteries). These deposits of calcium and plaques can partly clog and narrow the coronary arteries without producing any symptoms or warning signs. This puts a person at risk for a heart attack. This test can detect these deposits before symptoms develop. Tell a health care provider about: Any allergies you have. All medicines you are taking, including vitamins, herbs, eye drops, creams, and over-the-counter medicines. Any problems you or family members have had with anesthetic medicines. Any blood disorders you have. Any surgeries you have had. Any medical conditions you have. Whether you are pregnant or may be pregnant. What are the risks? Generally, this is a safe procedure. However, problems may occur, including: Harm to a pregnant woman and her unborn baby. This test involves the use of radiation. Radiation exposure can be dangerous to a pregnant woman and her unborn baby. If you are pregnant, you generally should not have this procedure done. Slight increase in the risk of cancer. This is because of the radiation involved in the test. What happens before the procedure? No preparation is needed for this procedure. What happens during the procedure? You will undress and  remove any jewelry around your neck or chest. You will put on a hospital gown. Sticky electrodes will be placed on your chest. The electrodes will be connected to an electrocardiogram (ECG) machine to record a tracing of the electrical activity of your heart. A CT scanner will take pictures of your heart. During this time, you will be asked to lie still and hold your breath for 2-3 seconds while a picture of your heart is being taken. The procedure may vary among health care providers and hospitals. What happens after the procedure? You can get dressed. You can return to your normal activities. It is up to you to get the results of your test. Ask your health care provider, or the department that is doing the test, when your results will be ready. Summary A coronary calcium scan is an imaging test used to look for deposits of calcium and other fatty materials (plaques) in the inner lining of the blood vessels of the heart (coronary arteries). Generally, this is a safe procedure. Tell your health care provider if you are pregnant or may be pregnant. No preparation is needed for this procedure. A CT scanner will take pictures of your heart. You can return to your normal activities after the scan is done. This information is not intended to replace advice given to you by your health care provider. Make sure you discuss any questions you have with your health care provider. Document Released: 09/15/2007 Document Revised: 02/06/2016 Document Reviewed: 02/06/2016 Elsevier Interactive Patient Education  2017 [employer].    Follow-Up: At [facility], you and your health needs are our priority.  As part of our continuing  mission to provide you with exceptional heart care, we have created designated Provider Care Teams.  These Care Teams include your primary Cardiologist (physician) and Advanced Practice Providers (APPs -  Physician Assistants and Nurse Practitioners) who all work together to  provide you with the care you need, when you need it.  We recommend signing up for the patient portal called "MyChart".  Sign up information is provided on this After Visit Summary.  MyChart is used to connect with patients for Virtual Visits (Telemedicine).  Patients are able to view lab/test results, encounter notes, upcoming appointments, etc.  Non-urgent messages can be sent to your provider as well.   To learn more about what you can do with MyChart, go to [email or web address].    Your next appointment:   12 month(s)  Provider:   Nanetta Batty, MD

## 2022-09-10 ENCOUNTER — Ambulatory Visit (HOSPITAL_BASED_OUTPATIENT_CLINIC_OR_DEPARTMENT_OTHER)
Admission: RE | Admit: 2022-09-10 | Discharge: 2022-09-10 | Disposition: A | Discharge status: Home / Self Care | Payer: Commercial Managed Care - PPO | Source: Ambulatory Visit | Attending: Cardiovascular Disease | Admitting: Cardiovascular Disease

## 2022-09-10 DIAGNOSIS — I10 Essential (primary) hypertension: Secondary | ICD-10-CM | POA: Insufficient documentation

## 2022-09-10 DIAGNOSIS — E782 Mixed hyperlipidemia: Secondary | ICD-10-CM | POA: Insufficient documentation

## 2022-09-24 ENCOUNTER — Other Ambulatory Visit (HOSPITAL_COMMUNITY): Payer: Self-pay

## 2022-09-24 MED ORDER — METFORMIN HCL 500 MG PO TABS
500.0000 mg | ORAL_TABLET | Freq: Two times a day (BID) | ORAL | 1 refills | Status: DC
  Filled 2022-09-24 – 2022-12-04 (×2): qty 180, 90d supply, fill #0
  Filled 2023-02-25: qty 180, 90d supply, fill #1

## 2022-09-24 MED ORDER — LISINOPRIL 10 MG PO TABS
10.0000 mg | ORAL_TABLET | Freq: Every day | ORAL | 1 refills | Status: DC
  Filled 2022-09-24 – 2022-12-04 (×2): qty 90, 90d supply, fill #0
  Filled 2023-02-25: qty 90, 90d supply, fill #1

## 2022-09-28 ENCOUNTER — Telehealth: Payer: Self-pay | Admitting: *Deleted

## 2022-09-28 NOTE — Telephone Encounter (Signed)
   Pre-operative Risk Assessment    Patient Name: Adam [name]  DOB: 1955/07/02 MRN: [id number]      Request for Surgical Clearance    Procedure:   COLONOSCOPY  Date of Surgery:  Clearance 11/12/22                                 Surgeon:  DR. MANN Surgeon's Group or Practice Name:  [facility] Phone number:  [phone] Fax number:  [phone]   Type of Clearance Requested:   - Medical ; NO MEDICATIONS LISTED AS NEEDING TO BE HELD   Type of Anesthesia:   PROPOFOL   Additional requests/questions:    Elpidio Anis   09/28/2022, 2:28 PM

## 2022-09-28 NOTE — Telephone Encounter (Signed)
Call placed this afternoon to see how patient has been doing since appointment last month.  He was not available manage detailed message left for him to call back at his earliest convenience.  Robin Searing, NP

## 2022-10-02 ENCOUNTER — Other Ambulatory Visit (HOSPITAL_COMMUNITY): Payer: Self-pay

## 2022-10-08 NOTE — Telephone Encounter (Signed)
Dr. Allyson Sabal,  You saw this patient on 08/10/2022. Will you please comment on medical clearance for colonoscopy?  Please route your response to P CV DIV Preop. I will communicate with requesting office once you have given recommendations.   Thank you!  Carlos Levering, NP

## 2022-10-08 NOTE — Telephone Encounter (Signed)
   Name: Adam [name]  DOB: 1956/02/12  MRN: [id number]   Primary Cardiologist: None  Chart reviewed as part of pre-operative protocol coverage. WILLA STOCKSTILL was last seen on 08/10/2022 by Dr. Allyson Sabal.  Per Dr. Allyson Sabal "Cleared for colonoscopy from cardiology perspective."  Therefore, based on ACC/AHA guidelines, the patient would be at acceptable risk for the planned procedure without further cardiovascular testing.   I will route this recommendation to the requesting party via Epic fax function and remove from pre-op pool. Please call with questions.  Carlos Levering, NP 10/08/2022, 3:09 PM

## 2022-10-09 ENCOUNTER — Other Ambulatory Visit (HOSPITAL_COMMUNITY): Payer: Self-pay

## 2022-10-29 ENCOUNTER — Other Ambulatory Visit (HOSPITAL_COMMUNITY): Payer: Self-pay

## 2022-10-31 ENCOUNTER — Other Ambulatory Visit (HOSPITAL_COMMUNITY): Payer: Self-pay

## 2022-10-31 MED ORDER — GOLYTELY 236 G PO SOLR
ORAL | 0 refills | Status: DC
  Filled 2022-10-31: qty 4000, 1d supply, fill #0

## 2022-11-05 ENCOUNTER — Other Ambulatory Visit (HOSPITAL_COMMUNITY): Payer: Self-pay

## 2022-11-12 DIAGNOSIS — K640 First degree hemorrhoids: Secondary | ICD-10-CM | POA: Diagnosis not present

## 2022-11-12 DIAGNOSIS — Z86010 Personal history of colonic polyps: Secondary | ICD-10-CM | POA: Diagnosis not present

## 2022-11-12 DIAGNOSIS — Z1211 Encounter for screening for malignant neoplasm of colon: Secondary | ICD-10-CM | POA: Diagnosis not present

## 2022-12-04 ENCOUNTER — Other Ambulatory Visit (HOSPITAL_COMMUNITY): Payer: Self-pay

## 2022-12-11 ENCOUNTER — Other Ambulatory Visit: Payer: Self-pay | Admitting: Thoracic Surgery (Cardiothoracic Vascular Surgery)

## 2022-12-11 DIAGNOSIS — I7121 Aneurysm of the ascending aorta, without rupture: Secondary | ICD-10-CM

## 2023-01-13 ENCOUNTER — Encounter (HOSPITAL_COMMUNITY): Payer: Self-pay

## 2023-01-13 ENCOUNTER — Ambulatory Visit (HOSPITAL_COMMUNITY)
Admission: EM | Admit: 2023-01-13 | Discharge: 2023-01-13 | Disposition: A | Discharge status: Home / Self Care | Payer: Commercial Managed Care - PPO | Attending: Emergency Medicine | Admitting: Emergency Medicine

## 2023-01-13 DIAGNOSIS — M79604 Pain in right leg: Secondary | ICD-10-CM | POA: Insufficient documentation

## 2023-01-13 LAB — CBC WITH DIFFERENTIAL/PLATELET
Abs Immature Granulocytes: 0.02 10*3/uL (ref 0.00–0.07)
Basophils Absolute: 0 10*3/uL (ref 0.0–0.1)
Basophils Relative: 1 %
Eosinophils Absolute: 0.2 10*3/uL (ref 0.0–0.5)
Eosinophils Relative: 5 %
HCT: 34.8 % — ABNORMAL LOW (ref 39.0–52.0)
Hemoglobin: 11 g/dL — ABNORMAL LOW (ref 13.0–17.0)
Immature Granulocytes: 0 %
Lymphocytes Relative: 43 %
Lymphs Abs: 2.1 10*3/uL (ref 0.7–4.0)
MCH: 25.2 pg — ABNORMAL LOW (ref 26.0–34.0)
MCHC: 31.6 g/dL (ref 30.0–36.0)
MCV: 79.8 fL — ABNORMAL LOW (ref 80.0–100.0)
Monocytes Absolute: 0.4 10*3/uL (ref 0.1–1.0)
Monocytes Relative: 8 %
Neutro Abs: 2.1 10*3/uL (ref 1.7–7.7)
Neutrophils Relative %: 43 %
Platelets: 164 10*3/uL (ref 150–400)
RBC: 4.36 MIL/uL (ref 4.22–5.81)
RDW: 16.7 % — ABNORMAL HIGH (ref 11.5–15.5)
WBC: 4.9 10*3/uL (ref 4.0–10.5)
nRBC: 0 % (ref 0.0–0.2)

## 2023-01-13 LAB — COMPREHENSIVE METABOLIC PANEL
ALT: 17 U/L (ref 0–44)
AST: 22 U/L (ref 15–41)
Albumin: 3 g/dL — ABNORMAL LOW (ref 3.5–5.0)
Alkaline Phosphatase: 61 U/L (ref 38–126)
Anion gap: 12 (ref 5–15)
BUN: 18 mg/dL (ref 8–23)
CO2: 24 mmol/L (ref 22–32)
Calcium: 9.5 mg/dL (ref 8.9–10.3)
Chloride: 105 mmol/L (ref 98–111)
Creatinine, Ser: 1.47 mg/dL — ABNORMAL HIGH (ref 0.61–1.24)
GFR, Estimated: 52 mL/min — ABNORMAL LOW (ref >60)
Glucose, Bld: 124 mg/dL — ABNORMAL HIGH (ref 70–99)
Potassium: 4.6 mmol/L (ref 3.5–5.1)
Sodium: 141 mmol/L (ref 135–145)
Total Bilirubin: 0.4 mg/dL (ref 0.3–1.2)
Total Protein: 10.2 g/dL — ABNORMAL HIGH (ref 6.5–8.1)

## 2023-01-13 LAB — CK: Total CK: 201 U/L (ref 49–397)

## 2023-01-13 LAB — SEDIMENTATION RATE: Sed Rate: 118 mm/h — ABNORMAL HIGH (ref 0–16)

## 2023-01-13 MED ORDER — DEXAMETHASONE SODIUM PHOSPHATE 10 MG/ML IJ SOLN
10.0000 mg | Freq: Once | INTRAMUSCULAR | Status: AC
  Administered 2023-01-13: 10 mg via INTRAMUSCULAR

## 2023-01-13 MED ORDER — METHOCARBAMOL 500 MG PO TABS: 500.0000 mg | ORAL_TABLET | Freq: Two times a day (BID) | ORAL | 0 refills | Status: DC

## 2023-01-13 MED ORDER — DEXAMETHASONE SODIUM PHOSPHATE 10 MG/ML IJ SOLN
INTRAMUSCULAR | Status: AC
  Filled 2023-01-13: qty 1

## 2023-01-13 NOTE — Discharge Instructions (Addendum)
We have given you a steroid in clinic to help with the inflammation.  Take the muscle relaxer twice daily, do not drink or drive on this medication.  Ensure you are drinking at least 64 ounces of water daily, you can add an electrolyte solution like Pedialyte or liquid IV to this as well.  We have obtained labs today in clinic we will contact you if there is anything that requires urgent or emergent follow-up.  Return to clinic for any new or urgent symptoms.  Seek immediate care if you develop any worsening symptoms, no improvement in symptoms despite interventions, calf swelling, or shortness of breath.

## 2023-01-13 NOTE — ED Triage Notes (Signed)
Right Leg Pain (Calf Muscle) x3 days. Patient has fallen but was due to the leg pain. No history of this. Pain in the right buttocks down into the ankle.

## 2023-01-13 NOTE — ED Provider Notes (Signed)
[facility]    CSN: [id number] Arrival date & time: 01/13/23  1058      History   Chief Complaint Chief Complaint  Patient presents with   Leg Pain    HPI Adam [name] is a 67 y.o. male.   Patient presents to clinic for pain in the right hip area that radiates down his right leg. No back pain.  The pain is a cramping and spasm sensation.  He currently feels a cramping sensation to the back of his calf.  He is able to fall asleep at night, when he turns sometimes his leg will lock up and spasm and this wakes him from sleep.  Denies any lower leg swelling.  No recent falls or trauma preceding pain. Did fall due to a spasm of the leg. No shortness of breath.  No cough or fever.  Has not had prolonged immobilization or prolonged travel.  He has tried [employer] for symptoms.  He is a type II diabetic, reports that his annual physical he was told that his last A1c was normal.  He has been taking 40 mg of rosuvastatin for over a year, was previously on the 20 mg dose.  No recent dose adjustments or medication changes.   The history is provided by the patient and medical records.  Leg Pain Associated symptoms: no fever     Past Medical History:  Diagnosis Date   Anxiety    with some things   Arthritis    BPH (benign prostatic hyperplasia)    Chronic hepatitis C without hepatic coma (HCC) 08/16/2014   finished treatment 4-5 mos ago, CMET in EPIC 08-11-15   H/O knee surgery 08/16/2014   Hepatitis C    Hyperlipidemia 08/16/2014   IGT (impaired glucose tolerance)    Lung nodule    left lower lobe, unchanged in 3 years as of 01/2019   Right renal mass     Patient Active Problem List   Diagnosis Date Noted   Thoracic aortic aneurysm (HCC) 08/10/2022   Coronary artery calcification 08/10/2022   Family history of heart disease 08/10/2022   Renal mass 01/27/2016   Acute cholecystitis 08/12/2015   Chronic hepatitis C without hepatic coma (HCC) 08/16/2014   HTN  (hypertension) 08/16/2014   H/O knee surgery 08/16/2014   Hyperlipidemia 08/16/2014    Past Surgical History:  Procedure Laterality Date   CHOLECYSTECTOMY N/A 08/12/2015   Procedure: LAPAROSCOPIC CHOLECYSTECTOMY WITH INTRAOPERATIVE CHOLANGIOGRAM;  Surgeon: Axel Filler, MD;  Location: [facility];  Service: General;  Laterality: N/A;   KNEE ARTHROSCOPY Bilateral    OPEN REDUCTION INTERNAL FIXATION (ORIF) DISTAL RADIAL FRACTURE Right 10/26/2015   Procedure: OPEN REDUCTION INTERNAL FIXATION (ORIF) DISTAL RADIAL FRACTURE;  Surgeon: Dairl Ponder, MD;  Location:  S[facility];  Service: Orthopedics;  Laterality: Right;   ROBOTIC ASSITED PARTIAL NEPHRECTOMY Right 01/27/2016   Procedure: XI ROBOTIC ASSITED PARTIAL NEPHRECTOMY;  Surgeon: Malen Gauze, MD;  Location: [facility];  Service: Urology;  Laterality: Right;       Home Medications    Prior to Admission medications   Medication Sig Start Date End Date Taking? Authorizing Provider  atorvastatin (LIPITOR) 80 MG tablet Take by mouth daily at 6 PM. 01/22/18  Yes [provider]  lisinopril (ZESTRIL) 10 MG tablet TAKE 1 TABLET BY MOUTH ONCE A DAY FOR KIDNEY PROTECTION 10/20/21  Yes   lisinopril (ZESTRIL) 10 MG tablet Take 1 tablet (10 mg total) by mouth daily. 09/24/22  Yes  metFORMIN (GLUCOPHAGE) 500 MG tablet Take 1 tablet (500 mg) by mouth 2 times daily. 09/24/22  Yes   methocarbamol (ROBAXIN) 500 MG tablet Take 1 tablet (500 mg total) by mouth 2 (two) times daily. 01/13/23  Yes Rinaldo Ratel, [place] N, FNP  polyethylene glycol (GOLYTELY) 236 g solution use as directed on package 10/31/22  Yes   rosuvastatin (CRESTOR) 40 MG tablet Take 1 tablet by mouth Once a day 10/19/21  Yes   rosuvastatin (CRESTOR) 40 MG tablet Take 1 tablet (40 mg total) by mouth daily. 04/12/22  Yes   metFORMIN (GLUCOPHAGE) 500 MG tablet TAKE 1 TABLET BY MOUTH TWICE A DAY WITH MEALS FOR DIABETES. Patient not taking: Reported on 08/10/2022 10/20/21        Family History Family History  Problem Relation Age of Onset   Heart disease Mother    Hyperlipidemia Mother    Hypertension Father    Cancer Father    Dementia Father    Diabetes Sister     Social History Social History   Tobacco Use   Smoking status: Never   Smokeless tobacco: Never  Substance Use Topics   Alcohol use: No   Drug use: Yes    Types: Marijuana    Comment: last joint 01/24/16     Allergies   Patient has no known allergies.   Review of Systems Review of Systems  Constitutional:  Negative for fever.  Respiratory:  Negative for cough and shortness of breath.   Cardiovascular:  Negative for chest pain and leg swelling.  Genitourinary:  Negative for dysuria.  Musculoskeletal:  Positive for gait problem and myalgias. Negative for joint swelling.     Physical Exam Triage Vital Signs ED Triage Vitals  Encounter Vitals Group     BP 01/13/23 1126 (!) 143/84     Systolic BP Percentile --      Diastolic BP Percentile --      Pulse Rate 01/13/23 1126 64     Resp 01/13/23 1126 18     Temp 01/13/23 1126 98.2 F (36.8 C)     Temp Source 01/13/23 1126 Oral     SpO2 01/13/23 1126 97 %     Weight 01/13/23 1125 173 lb 15.1 oz (78.9 kg)     Height 01/13/23 1125 6' (1.829 m)     Head Circumference --      Peak Flow --      Pain Score 01/13/23 1124 10     Pain Loc --      Pain Education --      Exclude from Growth Chart --    No data found.  Updated Vital Signs BP (!) 143/84 (BP Location: Right Arm)   Pulse 64   Temp 98.2 F (36.8 C) (Oral)   Resp 18   Ht 6' (1.829 m)   Wt 173 lb 15.1 oz (78.9 kg)   SpO2 97%   BMI 23.59 kg/m   Visual Acuity Right Eye Distance:   Left Eye Distance:   Bilateral Distance:    Right Eye Near:   Left Eye Near:    Bilateral Near:     Physical Exam Vitals and nursing note reviewed.  Constitutional:      Appearance: Normal appearance.  HENT:     Head: Normocephalic and atraumatic.     Right Ear: External  ear normal.     Left Ear: External ear normal.     Nose: Nose normal.     Mouth/Throat:  Mouth: Mucous membranes are moist.  Cardiovascular:     Rate and Rhythm: Normal rate.     Pulses: Normal pulses.  Pulmonary:     Effort: Pulmonary effort is normal. No respiratory distress.  Musculoskeletal:        General: No swelling, tenderness, deformity or signs of injury. Normal range of motion.     Right lower leg: No edema.     Left lower leg: No edema.  Skin:    General: Skin is warm and dry.     Capillary Refill: Capillary refill takes less than 2 seconds.     Coloration: Skin is not jaundiced.     Findings: No rash.  Neurological:     General: No focal deficit present.     Mental Status: He is alert and oriented to person, place, and time.  Psychiatric:        Mood and Affect: Mood normal.        Behavior: Behavior normal.      UC Treatments / Results  Labs (all labs ordered are listed, but only abnormal results are displayed) Labs Reviewed  COMPREHENSIVE METABOLIC PANEL  CBC WITH DIFFERENTIAL/PLATELET  CK  SEDIMENTATION RATE    EKG   Radiology No results found.  Procedures Procedures (including critical care time)  Medications Ordered in UC Medications  dexamethasone (DECADRON) injection 10 mg (has no administration in time range)    Initial Impression / Assessment and Plan / UC Course  I have reviewed the triage vital signs and the nursing notes.  Pertinent labs & imaging results that were available during my care of the patient were reviewed by me and considered in my medical decision making (see chart for details).  Vitals in triage reviewed, patient is hemodynamically stable.  No lower extremity edema.  Brisk capillary refill to right great toe, 2+ pedal pulses.  No pain with palpation.  No calf swelling.  Wells criteria for DVT Score is 0.  Pain radiates from right hip down right leg, suspicious for sciatica versus muscle spasm.  Will trial IM Decadron  and Robaxin.  Patient has been on rosuvastatin for years, less suspicious for statin induced musculoskeletal pain.  CBC, CMP, ESR and CK obtained to rule out acute muscular injury and metabolic abnormalities.  Strict emergency precautions given.  Plan of care, follow-up care discussed, no questions at this time.     Final Clinical Impressions(s) / UC Diagnoses   Final diagnoses:  Right leg pain     Discharge Instructions      We have given you a steroid in clinic to help with the inflammation.  Take the muscle relaxer twice daily, do not drink or drive on this medication.  Ensure you are drinking at least 64 ounces of water daily, you can add an electrolyte solution like Pedialyte or liquid IV to this as well.  We have obtained labs today in clinic we will contact you if there is anything that requires urgent or emergent follow-up.  Return to clinic for any new or urgent symptoms.  Seek immediate care if you develop any worsening symptoms, no improvement in symptoms despite interventions, calf swelling, or shortness of breath.       ED Prescriptions     Medication Sig Dispense Auth. Provider   methocarbamol (ROBAXIN) 500 MG tablet Take 1 tablet (500 mg total) by mouth 2 (two) times daily. 20 tablet Willella Harding, [place], [place]      PDMP not reviewed this encounter.   Rinaldo Ratel,  [place] N, [place] 01/13/23 1152

## 2023-01-15 ENCOUNTER — Encounter: Payer: Self-pay | Admitting: Thoracic Surgery (Cardiothoracic Vascular Surgery)

## 2023-01-21 ENCOUNTER — Other Ambulatory Visit (HOSPITAL_COMMUNITY): Payer: Self-pay

## 2023-02-06 NOTE — Progress Notes (Deleted)
      [address]       [place] [address]             [phone]        Adam [name] [id number] 09/19/1955  History of Present Illness:  Adam [name] is a 67 yo male with history of HLD, HTN, DM, H/O Renal Cell Carcinoma, Hep C (treated), pulmonary nodule of the LLL, and Ascending aortic aneurysm.  This has been stable at 4.1 cm.  He presents today for 1 year surveillance follow up.      Current Outpatient Medications on File Prior to Visit  Medication Sig Dispense Refill   atorvastatin (LIPITOR) 80 MG tablet Take by mouth daily at 6 PM.  0   lisinopril (ZESTRIL) 10 MG tablet TAKE 1 TABLET BY MOUTH ONCE A DAY FOR KIDNEY PROTECTION 90 tablet 0   lisinopril (ZESTRIL) 10 MG tablet Take 1 tablet (10 mg total) by mouth daily. 90 tablet 1   metFORMIN (GLUCOPHAGE) 500 MG tablet TAKE 1 TABLET BY MOUTH TWICE A DAY WITH MEALS FOR DIABETES. (Patient not taking: Reported on 08/10/2022) 180 tablet 0   metFORMIN (GLUCOPHAGE) 500 MG tablet Take 1 tablet (500 mg) by mouth 2 times daily. 180 tablet 1   methocarbamol (ROBAXIN) 500 MG tablet Take 1 tablet (500 mg total) by mouth 2 (two) times daily. 20 tablet 0   polyethylene glycol (GOLYTELY) 236 g solution use as directed on package 4000 mL 0   rosuvastatin (CRESTOR) 40 MG tablet Take 1 tablet by mouth Once a day 90 tablet 1   rosuvastatin (CRESTOR) 40 MG tablet Take 1 tablet (40 mg total) by mouth daily. 30 tablet 5   No current facility-administered medications on file prior to visit.     There were no vitals taken for this visit.  Physical Exam  CTA Results:      A/P:  Ascending Aortic Aneurysm- measuring Left Lower Lobe Lung Nodule HTN HLD DM     Patient was counseled on importance of Blood Pressure Control.  Despite Medical intervention if the patient notices persistently elevated blood pressure readings.  They are instructed to contact their Primary Care Physician  Please avoid use of  Fluoroquinolones as this can potentially increase your risk of Aortic Rupture and/or Dissection  Patient educated on signs and symptoms of Aortic Dissection, handout also provided in AVS  Adam Terhune, PA-C 02/06/23

## 2023-02-07 ENCOUNTER — Other Ambulatory Visit: Payer: Commercial Managed Care - PPO

## 2023-02-11 ENCOUNTER — Ambulatory Visit: Payer: Commercial Managed Care - PPO

## 2023-02-25 ENCOUNTER — Other Ambulatory Visit (HOSPITAL_COMMUNITY): Payer: Self-pay

## 2023-03-25 ENCOUNTER — Other Ambulatory Visit (HOSPITAL_COMMUNITY): Payer: Self-pay

## 2023-03-25 MED ORDER — METFORMIN HCL 500 MG PO TABS
500.0000 mg | ORAL_TABLET | Freq: Two times a day (BID) | ORAL | 0 refills | Status: DC
  Filled 2023-05-17: qty 180, 90d supply, fill #0

## 2023-03-25 MED ORDER — LISINOPRIL 10 MG PO TABS
10.0000 mg | ORAL_TABLET | Freq: Every day | ORAL | 0 refills | Status: DC
  Filled 2023-05-17: qty 90, 90d supply, fill #0

## 2023-03-26 ENCOUNTER — Other Ambulatory Visit (HOSPITAL_COMMUNITY): Payer: Self-pay

## 2023-03-29 DIAGNOSIS — R972 Elevated prostate specific antigen [PSA]: Secondary | ICD-10-CM | POA: Diagnosis not present

## 2023-03-29 DIAGNOSIS — Z125 Encounter for screening for malignant neoplasm of prostate: Secondary | ICD-10-CM | POA: Diagnosis not present

## 2023-03-29 DIAGNOSIS — I700 Atherosclerosis of aorta: Secondary | ICD-10-CM | POA: Diagnosis not present

## 2023-03-29 DIAGNOSIS — I712 Thoracic aortic aneurysm, without rupture, unspecified: Secondary | ICD-10-CM | POA: Diagnosis not present

## 2023-03-29 DIAGNOSIS — Z8619 Personal history of other infectious and parasitic diseases: Secondary | ICD-10-CM | POA: Diagnosis not present

## 2023-03-29 DIAGNOSIS — E1169 Type 2 diabetes mellitus with other specified complication: Secondary | ICD-10-CM | POA: Diagnosis not present

## 2023-03-29 DIAGNOSIS — E7800 Pure hypercholesterolemia, unspecified: Secondary | ICD-10-CM | POA: Diagnosis not present

## 2023-03-29 DIAGNOSIS — Z0000 Encounter for general adult medical examination without abnormal findings: Secondary | ICD-10-CM | POA: Diagnosis not present

## 2023-03-29 DIAGNOSIS — Z85528 Personal history of other malignant neoplasm of kidney: Secondary | ICD-10-CM | POA: Diagnosis not present

## 2023-03-29 DIAGNOSIS — R911 Solitary pulmonary nodule: Secondary | ICD-10-CM | POA: Diagnosis not present

## 2023-05-17 ENCOUNTER — Other Ambulatory Visit (HOSPITAL_COMMUNITY): Payer: Self-pay

## 2023-05-17 ENCOUNTER — Other Ambulatory Visit: Payer: Self-pay

## 2023-05-17 MED ORDER — ROSUVASTATIN CALCIUM 40 MG PO TABS
40.0000 mg | ORAL_TABLET | Freq: Every day | ORAL | 0 refills | Status: DC
  Filled 2023-05-17: qty 90, 90d supply, fill #0

## 2023-06-21 ENCOUNTER — Other Ambulatory Visit (HOSPITAL_COMMUNITY): Payer: Self-pay

## 2023-09-05 ENCOUNTER — Other Ambulatory Visit (HOSPITAL_COMMUNITY): Payer: Self-pay

## 2023-09-05 MED ORDER — LISINOPRIL 10 MG PO TABS
10.0000 mg | ORAL_TABLET | Freq: Every day | ORAL | 0 refills | Status: DC
  Filled 2023-09-05: qty 90, 90d supply, fill #0

## 2023-09-05 MED ORDER — ROSUVASTATIN CALCIUM 40 MG PO TABS
40.0000 mg | ORAL_TABLET | Freq: Every day | ORAL | 0 refills | Status: DC
  Filled 2023-09-05: qty 90, 90d supply, fill #0

## 2023-09-05 MED ORDER — METFORMIN HCL 500 MG PO TABS
500.0000 mg | ORAL_TABLET | Freq: Two times a day (BID) | ORAL | 0 refills | Status: DC
  Filled 2023-09-05: qty 180, 90d supply, fill #0

## 2023-09-30 DIAGNOSIS — N1831 Chronic kidney disease, stage 3a: Secondary | ICD-10-CM | POA: Diagnosis not present

## 2023-09-30 DIAGNOSIS — Z8619 Personal history of other infectious and parasitic diseases: Secondary | ICD-10-CM | POA: Diagnosis not present

## 2023-09-30 DIAGNOSIS — E7800 Pure hypercholesterolemia, unspecified: Secondary | ICD-10-CM | POA: Diagnosis not present

## 2023-09-30 DIAGNOSIS — Z85528 Personal history of other malignant neoplasm of kidney: Secondary | ICD-10-CM | POA: Diagnosis not present

## 2023-09-30 DIAGNOSIS — E1169 Type 2 diabetes mellitus with other specified complication: Secondary | ICD-10-CM | POA: Diagnosis not present

## 2023-09-30 DIAGNOSIS — I712 Thoracic aortic aneurysm, without rupture, unspecified: Secondary | ICD-10-CM | POA: Diagnosis not present

## 2023-09-30 DIAGNOSIS — L918 Other hypertrophic disorders of the skin: Secondary | ICD-10-CM | POA: Diagnosis not present

## 2023-09-30 DIAGNOSIS — R911 Solitary pulmonary nodule: Secondary | ICD-10-CM | POA: Diagnosis not present

## 2023-09-30 DIAGNOSIS — I2510 Atherosclerotic heart disease of native coronary artery without angina pectoris: Secondary | ICD-10-CM | POA: Diagnosis not present

## 2023-10-01 ENCOUNTER — Other Ambulatory Visit (HOSPITAL_COMMUNITY): Payer: Self-pay

## 2023-10-01 MED ORDER — JARDIANCE 10 MG PO TABS
10.0000 mg | ORAL_TABLET | Freq: Every day | ORAL | 0 refills | Status: AC
  Filled 2023-10-01: qty 90, 90d supply, fill #0

## 2023-10-02 ENCOUNTER — Other Ambulatory Visit (HOSPITAL_COMMUNITY): Payer: Self-pay

## 2023-12-03 ENCOUNTER — Other Ambulatory Visit: Payer: Self-pay

## 2023-12-03 ENCOUNTER — Other Ambulatory Visit (HOSPITAL_COMMUNITY): Payer: Self-pay

## 2023-12-03 MED ORDER — ROSUVASTATIN CALCIUM 40 MG PO TABS
40.0000 mg | ORAL_TABLET | Freq: Every day | ORAL | 0 refills | Status: DC
  Filled 2023-12-03: qty 90, 90d supply, fill #0

## 2023-12-03 MED ORDER — METFORMIN HCL 500 MG PO TABS
500.0000 mg | ORAL_TABLET | Freq: Two times a day (BID) | ORAL | 0 refills | Status: DC
  Filled 2023-12-03: qty 180, 90d supply, fill #0

## 2023-12-03 MED ORDER — LISINOPRIL 10 MG PO TABS
10.0000 mg | ORAL_TABLET | Freq: Every day | ORAL | 0 refills | Status: DC
  Filled 2023-12-03: qty 90, 90d supply, fill #0

## 2023-12-04 ENCOUNTER — Other Ambulatory Visit (HOSPITAL_COMMUNITY): Payer: Self-pay

## 2024-01-02 DIAGNOSIS — E1169 Type 2 diabetes mellitus with other specified complication: Secondary | ICD-10-CM | POA: Diagnosis not present

## 2024-01-29 ENCOUNTER — Other Ambulatory Visit (HOSPITAL_COMMUNITY): Payer: Self-pay

## 2024-02-28 ENCOUNTER — Encounter (HOSPITAL_COMMUNITY): Payer: Self-pay | Admitting: Emergency Medicine

## 2024-02-28 ENCOUNTER — Other Ambulatory Visit (HOSPITAL_COMMUNITY): Payer: Self-pay

## 2024-02-28 ENCOUNTER — Ambulatory Visit (INDEPENDENT_AMBULATORY_CARE_PROVIDER_SITE_OTHER)

## 2024-02-28 ENCOUNTER — Ambulatory Visit (HOSPITAL_COMMUNITY)
Admission: EM | Admit: 2024-02-28 | Discharge: 2024-02-28 | Disposition: A | Discharge status: Home / Self Care | Attending: Physician Assistant | Admitting: Physician Assistant

## 2024-02-28 ENCOUNTER — Ambulatory Visit (HOSPITAL_COMMUNITY): Payer: Self-pay | Admitting: Physician Assistant

## 2024-02-28 DIAGNOSIS — M25511 Pain in right shoulder: Secondary | ICD-10-CM

## 2024-02-28 DIAGNOSIS — S4351XA Sprain of right acromioclavicular joint, initial encounter: Secondary | ICD-10-CM | POA: Diagnosis not present

## 2024-02-28 DIAGNOSIS — M898X9 Other specified disorders of bone, unspecified site: Secondary | ICD-10-CM | POA: Diagnosis not present

## 2024-02-28 DIAGNOSIS — S46011A Strain of muscle(s) and tendon(s) of the rotator cuff of right shoulder, initial encounter: Secondary | ICD-10-CM

## 2024-02-28 MED ORDER — LIDOCAINE 5 % EX OINT
1.0000 | TOPICAL_OINTMENT | CUTANEOUS | 0 refills | Status: AC | PRN
Start: 2024-02-28 — End: ?
  Filled 2024-02-28: qty 35.44, 30d supply, fill #0

## 2024-02-28 MED ORDER — BACLOFEN 5 MG PO TABS
5.0000 mg | ORAL_TABLET | Freq: Every evening | ORAL | 0 refills | Status: AC | PRN
  Filled 2024-02-28: qty 7, 7d supply, fill #0
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
## 2024-02-28 NOTE — Discharge Instructions (Signed)
 I do not see anything broken or out of place on your x-ray.  I will contact you if the radiologist sees something that I did not.  Apply lidocaine  ointment as needed to help with pain.  You can continue Tylenol /acetaminophen  for additional pain relief.  Take baclofen at night.  This will make you sleepy so do not drive or drink alcohol while taking it.  I am very concerned about your rotator cuff I would like you to follow-up with an orthopedic provider as soon as possible.  Call them to schedule an appointment.  If anything worsens you have increasing pain, numbness or tingling in your hand, fever, nausea/vomiting you need to be seen immediately.

## 2024-02-28 NOTE — ED Triage Notes (Signed)
 Patient c/o right shoulder pain x 1 month.  No recent injury.  Feels like it's in the socket.  Patient has taken Tylenol , Ibuprofen  and Tramadol.

## 2024-03-06 ENCOUNTER — Other Ambulatory Visit (HOSPITAL_COMMUNITY): Payer: Self-pay

## 2024-03-06 MED ORDER — LISINOPRIL 10 MG PO TABS
10.0000 mg | ORAL_TABLET | Freq: Every day | ORAL | 0 refills | Status: AC
  Filled 2024-03-06: qty 90, 90d supply, fill #0

## 2024-03-06 MED ORDER — METFORMIN HCL 500 MG PO TABS
500.0000 mg | ORAL_TABLET | Freq: Two times a day (BID) | ORAL | 0 refills | Status: AC
  Filled 2024-03-06: qty 180, 90d supply, fill #0

## 2024-03-19 ENCOUNTER — Other Ambulatory Visit (HOSPITAL_COMMUNITY): Payer: Self-pay | Admitting: Sports Medicine

## 2024-03-19 ENCOUNTER — Ambulatory Visit (HOSPITAL_COMMUNITY)
Admission: RE | Admit: 2024-03-19 | Discharge: 2024-03-19 | Disposition: A | Discharge status: Home / Self Care | Source: Ambulatory Visit | Attending: Sports Medicine | Admitting: Sports Medicine

## 2024-03-19 ENCOUNTER — Other Ambulatory Visit (HOSPITAL_COMMUNITY): Payer: Self-pay

## 2024-03-19 DIAGNOSIS — M25511 Pain in right shoulder: Secondary | ICD-10-CM | POA: Insufficient documentation

## 2024-03-19 MED ORDER — GADOBUTROL 1 MMOL/ML IV SOLN: 7.5000 mL | Freq: Once | INTRAVENOUS | Status: DC | PRN

## 2024-03-19 MED ORDER — GADOBUTROL 1 MMOL/ML IV SOLN
7.5000 mL | Freq: Once | INTRAVENOUS | Status: AC | PRN
  Administered 2024-03-19: 18:00:00 7.5 mL via INTRAVENOUS

## 2024-03-19 MED ORDER — HYDROCODONE-ACETAMINOPHEN 5-325 MG PO TABS
1.0000 | ORAL_TABLET | ORAL | 0 refills | Status: DC
  Filled 2024-03-19: qty 30, 5d supply, fill #0

## 2024-03-30 DIAGNOSIS — R972 Elevated prostate specific antigen [PSA]: Secondary | ICD-10-CM | POA: Diagnosis not present

## 2024-03-30 DIAGNOSIS — C641 Malignant neoplasm of right kidney, except renal pelvis: Secondary | ICD-10-CM | POA: Diagnosis not present

## 2024-04-01 ENCOUNTER — Other Ambulatory Visit (HOSPITAL_COMMUNITY): Payer: Self-pay

## 2024-04-01 MED ORDER — HYDROCODONE-ACETAMINOPHEN 5-325 MG PO TABS
1.0000 | ORAL_TABLET | Freq: Three times a day (TID) | ORAL | 0 refills | Status: DC | PRN
  Filled 2024-04-01: qty 40, 14d supply, fill #0

## 2024-04-09 ENCOUNTER — Encounter: Payer: Self-pay | Admitting: Medical Oncology

## 2024-04-10 ENCOUNTER — Encounter: Payer: Self-pay | Admitting: Medical Oncology

## 2024-04-10 ENCOUNTER — Other Ambulatory Visit: Payer: Self-pay | Admitting: Medical Oncology

## 2024-04-10 ENCOUNTER — Encounter: Payer: Self-pay | Admitting: Physician Assistant

## 2024-04-10 ENCOUNTER — Inpatient Hospital Stay
Admission: RE | Admit: 2024-04-10 | Discharge: 2024-04-10 | Disposition: A | Payer: Self-pay | Source: Ambulatory Visit | Attending: Physician Assistant

## 2024-04-10 ENCOUNTER — Inpatient Hospital Stay: Attending: Physician Assistant | Admitting: Physician Assistant

## 2024-04-10 ENCOUNTER — Inpatient Hospital Stay

## 2024-04-10 VITALS — BP 151/90 | HR 94 | Temp 98.1°F | Resp 17 | Wt 167.8 lb

## 2024-04-10 DIAGNOSIS — Z8051 Family history of malignant neoplasm of kidney: Secondary | ICD-10-CM | POA: Diagnosis not present

## 2024-04-10 DIAGNOSIS — R936 Abnormal findings on diagnostic imaging of limbs: Secondary | ICD-10-CM | POA: Insufficient documentation

## 2024-04-10 DIAGNOSIS — M899 Disorder of bone, unspecified: Secondary | ICD-10-CM | POA: Diagnosis not present

## 2024-04-10 DIAGNOSIS — M898X9 Other specified disorders of bone, unspecified site: Secondary | ICD-10-CM

## 2024-04-10 DIAGNOSIS — M25511 Pain in right shoulder: Secondary | ICD-10-CM | POA: Diagnosis not present

## 2024-04-10 DIAGNOSIS — Z809 Family history of malignant neoplasm, unspecified: Secondary | ICD-10-CM | POA: Diagnosis not present

## 2024-04-10 DIAGNOSIS — D509 Iron deficiency anemia, unspecified: Secondary | ICD-10-CM | POA: Insufficient documentation

## 2024-04-10 DIAGNOSIS — I10 Essential (primary) hypertension: Secondary | ICD-10-CM | POA: Insufficient documentation

## 2024-04-10 LAB — CMP (CANCER CENTER ONLY)
ALT: 9 U/L (ref 0–44)
AST: 18 U/L (ref 15–41)
Albumin: 3.4 g/dL — ABNORMAL LOW (ref 3.5–5.0)
Alkaline Phosphatase: 63 U/L (ref 38–126)
Anion gap: 18 — ABNORMAL HIGH (ref 5–15)
BUN: 17 mg/dL (ref 8–23)
CO2: 22 mmol/L (ref 22–32)
Calcium: 9.9 mg/dL (ref 8.9–10.3)
Chloride: 100 mmol/L (ref 98–111)
Creatinine: 1.43 mg/dL — ABNORMAL HIGH (ref 0.61–1.24)
GFR, Estimated: 53 mL/min — ABNORMAL LOW
Glucose, Bld: 90 mg/dL (ref 70–99)
Potassium: 4.1 mmol/L (ref 3.5–5.1)
Sodium: 140 mmol/L (ref 135–145)
Total Bilirubin: 0.3 mg/dL (ref 0.0–1.2)
Total Protein: >12 g/dL — ABNORMAL HIGH (ref 6.5–8.1)

## 2024-04-10 LAB — CBC WITH DIFFERENTIAL (CANCER CENTER ONLY)
Abs Immature Granulocytes: 0.02 K/uL (ref 0.00–0.07)
Basophils Absolute: 0 K/uL (ref 0.0–0.1)
Basophils Relative: 0 %
Eosinophils Absolute: 0.2 K/uL (ref 0.0–0.5)
Eosinophils Relative: 3 %
HCT: 24 % — ABNORMAL LOW (ref 39.0–52.0)
Hemoglobin: 7.4 g/dL — ABNORMAL LOW (ref 13.0–17.0)
Immature Granulocytes: 0 %
Lymphocytes Relative: 46 %
Lymphs Abs: 2.3 K/uL (ref 0.7–4.0)
MCH: 23.5 pg — ABNORMAL LOW (ref 26.0–34.0)
MCHC: 30.8 g/dL (ref 30.0–36.0)
MCV: 76.2 fL — ABNORMAL LOW (ref 80.0–100.0)
Monocytes Absolute: 0.4 K/uL (ref 0.1–1.0)
Monocytes Relative: 7 %
Neutro Abs: 2.3 K/uL (ref 1.7–7.7)
Neutrophils Relative %: 44 %
Platelet Count: 154 K/uL (ref 150–400)
RBC: 3.15 MIL/uL — ABNORMAL LOW (ref 4.22–5.81)
RDW: 25.3 % — ABNORMAL HIGH (ref 11.5–15.5)
WBC Count: 5.1 K/uL (ref 4.0–10.5)
nRBC: 0 % (ref 0.0–0.2)

## 2024-04-10 LAB — PSA: Prostatic Specific Antigen: 5.86 ng/mL — ABNORMAL HIGH (ref 0.00–4.00)

## 2024-04-10 NOTE — Progress Notes (Unsigned)
 " Rapid Diagnostic Service for Malignancy [facility] [phone]   [phone]  INITIAL CONSULTATION:  Patient Care Team: Alvera Reagin, PA as PCP - General (Nurse Practitioner) Hays Morrison, CRNP as Nurse Practitioner (Nurse Practitioner) Golden Forestine BROCKS, RN as Oncology Nurse Navigator (Medical Oncology)  CHIEF COMPLAINTS/PURPOSE OF CONSULTATION:  Lytic lesion involving right scapula   HISTORY OF PRESENTING ILLNESS:  Adam [name] 69 y.o. male with medical history significant for hepatitis C treated with Harvoni , hyperlipidemia, renal cell carcinoma s/p partial right nephrectomy who presents to the rapid diagnostic clinic for evaluation of lytic lesion involving the right scapula. He is unaccompanied for this visit.   On review of the previous records, Adam [name] presented to ortho due to right shoulder pain and decreased range of motion for 1-2 months. He underwent MRI of right shoulder which revealed lytic expansile destructive enhancing 5.1 x 3.2 x 3.8 cm right scapular mass with extraosseous extension, glenoid and glenoid neck involvement, periostitis,and diffuse abnormal scapular marrow enhancement, with additional abnormal distal clavicular marrow enhancement suspicious for multifocal osseous involvement.  On exam today, Adam [name] reports that he continues to have right shoulder pain and decreased range of motion. He currently takes Baclofen  5 mg and  Norco 5-325 as needed with some improvement of pain. He denies any changes to his energy or appetite. He denies nausea, vomiting or bowel habit changes. He denies easy bruising or overt signs of bleeding. He denies fevers, chills, sweats, shortness of breath, chest pain or cough. He has no other complaints. Rest of the ROS is below.   MEDICAL HISTORY:  Past Medical History:  Diagnosis Date   Anxiety    with some things   Arthritis    BPH (benign prostatic hyperplasia)    Chronic  hepatitis C without hepatic coma (HCC) 08/16/2014   finished treatment 4-5 mos ago, CMET in EPIC 08-11-15   H/O knee surgery 08/16/2014   Hepatitis C    Hyperlipidemia 08/16/2014   IGT (impaired glucose tolerance)    Lung nodule    left lower lobe, unchanged in 3 years as of 01/2019   Right renal mass     SURGICAL HISTORY: Past Surgical History:  Procedure Laterality Date   CHOLECYSTECTOMY N/A 08/12/2015   Procedure: LAPAROSCOPIC CHOLECYSTECTOMY WITH INTRAOPERATIVE CHOLANGIOGRAM;  Surgeon: Lynda Leos, MD;  Location: [facility];  Service: General;  Laterality: N/A;   KNEE ARTHROSCOPY Bilateral    OPEN REDUCTION INTERNAL FIXATION (ORIF) DISTAL RADIAL FRACTURE Right 10/26/2015   Procedure: OPEN REDUCTION INTERNAL FIXATION (ORIF) DISTAL RADIAL FRACTURE;  Surgeon: Donnice Robinsons, MD;  Location: [facility];  Service: Orthopedics;  Laterality: Right;   ROBOTIC ASSITED PARTIAL NEPHRECTOMY Right 01/27/2016   Procedure: XI ROBOTIC ASSITED PARTIAL NEPHRECTOMY;  Surgeon: Belvie LITTIE Clara, MD;  Location: [facility];  Service: Urology;  Laterality: Right;    SOCIAL HISTORY: Social History   Socioeconomic History   Marital status: Married    Spouse name: Not on file   Number of children: Not on file   Years of education: Not on file   Highest education level: Not on file  Occupational History   Not on file  Tobacco Use   Smoking status: Never   Smokeless tobacco: Never  Vaping Use   Vaping status: Never Used  Substance and Sexual Activity   Alcohol use: No   Drug use: Yes    Types: Marijuana    Comment: last joint 01/24/16   Sexual activity: Yes  Other Topics Concern   Not on file  Social History Narrative   Not on file   Social Drivers of Health   Tobacco Use: Low Risk (04/10/2024)   Patient History    Smoking Tobacco Use: Never    Smokeless Tobacco Use: Never    Passive Exposure: Not on file  Financial Resource Strain: Not on file  Food Insecurity: Not on file   Transportation Needs: Not on file  Physical Activity: Not on file  Stress: Not on file  Social Connections: Not on file  Intimate Partner Violence: Not on file  Depression (EYV7-0): Not on file  Alcohol Screen: Not on file  Housing: Not on file  Utilities: Not on file  Health Literacy: Not on file    FAMILY HISTORY: Family History  Problem Relation Age of Onset   Heart disease Mother    Hyperlipidemia Mother    Hypertension Father    Cancer Father    Dementia Father    Diabetes Sister     ALLERGIES:  has no known allergies.  MEDICATIONS:  Current Outpatient Medications  Medication Sig Dispense Refill   atorvastatin  (LIPITOR) 80 MG tablet Take by mouth daily at 6 PM.  0   Baclofen  5 MG TABS Take 1 tablet (5 mg total) by mouth at bedtime as needed. 7 tablet 0   empagliflozin  (JARDIANCE ) 10 MG TABS tablet Take 1 tablet (10 mg total) by mouth daily. 90 tablet 0   HYDROcodone -acetaminophen  (NORCO/VICODIN) 5-325 MG tablet Take 1 tablet by mouth 3 (three) times daily as needed for pain suspected to be related to cancer. 40 tablet 0   lidocaine  (XYLOCAINE ) 5 % ointment Apply 1 Application topically as needed. 35.44 g 0   lisinopril  (ZESTRIL ) 10 MG tablet TAKE 1 TABLET BY MOUTH ONCE A DAY FOR KIDNEY PROTECTION 90 tablet 0   lisinopril  (ZESTRIL ) 10 MG tablet Take 1 tablet (10 mg total) by mouth daily. 90 tablet 0   metFORMIN  (GLUCOPHAGE ) 500 MG tablet Take 1 tablet (500 mg total) by mouth 2 (two) times daily. 180 tablet 0   No current facility-administered medications for this visit.    REVIEW OF SYSTEMS:   Constitutional: ( - ) fevers, ( - )  chills , ( - ) night sweats Eyes: ( - ) blurriness of vision, ( - ) double vision, ( - ) watery eyes Ears, nose, mouth, throat, and face: ( - ) mucositis, ( - ) sore throat Respiratory: ( - ) cough, ( - ) dyspnea, ( - ) wheezes Cardiovascular: ( - ) palpitation, ( - ) chest discomfort, ( - ) lower extremity swelling Gastrointestinal:  ( -  ) nausea, ( - ) heartburn, ( - ) change in bowel habits Skin: ( - ) abnormal skin rashes Lymphatics: ( - ) new lymphadenopathy, ( - ) easy bruising Neurological: ( - ) numbness, ( - ) tingling, ( - ) new weaknesses Behavioral/Psych: ( - ) mood change, ( - ) new changes  All other systems were reviewed with the patient and are negative.  PHYSICAL EXAMINATION: ECOG PERFORMANCE STATUS: 1 - Symptomatic but completely ambulatory  Vitals:   04/10/24 1433  BP: (!) 151/90  Pulse: 94  Resp: 17  Temp: 98.1 F (36.7 C)  SpO2: 100%   Filed Weights   04/10/24 1433  Weight: 167 lb 12.8 oz (76.1 kg)    GENERAL: well appearing male in NAD  SKIN: skin color, texture, turgor are normal, no rashes or significant lesions EYES: conjunctiva  are pink and non-injected, sclera clear LUNGS: clear to auscultation and percussion with normal breathing effort HEART: regular rate & rhythm and no murmurs and no lower extremity edema Musculoskeletal: no cyanosis of digits and no clubbing. No tenderness to palpation in right shoulder region but decreased range of motion.  PSYCH: alert & oriented x 3, fluent speech NEURO: no focal motor/sensory deficits  LABORATORY DATA:  I have reviewed the data as listed    Latest Ref Rng & Units 01/13/2023   11:44 AM 10/03/2017   12:31 PM 01/28/2016    5:12 AM  CBC  WBC 4.0 - 10.5 K/uL 4.9     Hemoglobin 13.0 - 17.0 g/dL 88.9  83.9  87.2   Hematocrit 39.0 - 52.0 % 34.8  47.0  39.1   Platelets 150 - 400 K/uL 164          Latest Ref Rng & Units 01/13/2023   11:44 AM 10/03/2017   12:31 PM 01/28/2016    5:12 AM  CMP  Glucose 70 - 99 mg/dL 875  868  849   BUN 8 - 23 mg/dL 18  14  11    Creatinine 0.61 - 1.24 mg/dL 8.52  8.99  8.93   Sodium 135 - 145 mmol/L 141  142  139   Potassium 3.5 - 5.1 mmol/L 4.6  4.1  3.4   Chloride 98 - 111 mmol/L 105  103  104   CO2 22 - 32 mmol/L 24   27   Calcium  8.9 - 10.3 mg/dL 9.5   8.8   Total Protein 6.5 - 8.1 g/dL 89.7     Total  Bilirubin 0.3 - 1.2 mg/dL 0.4     Alkaline Phos 38 - 126 U/L 61     AST 15 - 41 U/L 22     ALT 0 - 44 U/L 17        RADIOGRAPHIC STUDIES: I have personally reviewed the radiological images as listed and agreed with the findings in the report. MR SHOULDER RIGHT W WO CONTRAST Result Date: 03/20/2024 EXAM: MRI OF THE RIGHT SHOULDER WITH AND WITHOUT CONTRAST 03/19/2024 06:48:40 PM TECHNIQUE: Multiplanar multisequence MRI of the right shoulder was performed with and without the administration of intravenous contrast (7.5 mL gadobutrol  (GADAVIST ) 1 MMOL/ML injection). COMPARISON: Chest CT dated 01/25/2022. CLINICAL HISTORY: Mass of the scapula. FINDINGS: ROTATOR CUFF: Mildly atrophic infraspinatus muscle. Mild supraspinatus tendinopathy. Subscapularis and teres minor tendons are intact. No significant muscle edema. BICEPS TENDON: Long head biceps tendon is intact and normally located. LABRUM: No gross labral tear or paralabral cyst. GLENOHUMERAL JOINT: Physiologic amount of joint fluid. No high-grade cartilage loss. Normal alignment. AC JOINT AND ACROMIOCLAVICULAR ARCH: Curved acromial undersurface. Mildly widened acromioclavicular joint at 1.0 cm, significance uncertain. No significant acromial downsloping or subacromial spur. Intact acromioclavicular and coracoclavicular ligaments. BURSA: No significant subacromial/subdeltoid bursitis. BONE MARROW: Lytic expansile destructive enhancing 5.1 x 3.2 x 3.8 cm mass of the scapula with extraosseous extension anteriorly and posteriorly as well as inferiorly, involving the glenoid and glenoid neck, with associated periostitis and abnormal enhancement throughout the scapula. Mildly abnormal enhancement in the distal clavicle marrow. This lesion was not present on 01/25/2022 chest CT. Differential diagnosis: 1. Metastatic disease (e.g., from lung, prostate, kidney, thyroid, breast, melanoma, gastrointestinal tract). 2. Multiple myeloma/plasmacytoma. 3. Chondrosarcoma.  4. Osteosarcoma. 6. Lymphoma. OUTLET SPACES: Normal MRI appearance of the quadrilateral space. No significant narrowing of the supraspinatus outlet. SOFT TISSUES: Extraosseous extension of the scapular mass anteriorly and posteriorly  as well as inferiorly. IMPRESSION: 1. Lytic expansile destructive enhancing 5.1 x 3.2 x 3.8 cm right scapular mass with extraosseous extension, glenoid and glenoid neck involvement, periostitis, and diffuse abnormal scapular marrow enhancement, with additional abnormal distal clavicular marrow enhancement suspicious for multifocal osseous involvement. 2. Differential diagnosis includes metastatic disease, solitary plasmacytoma/multiple myeloma, chondrosarcoma, primary osseous lymphoma, and telangiectatic osteosarcoma. Infection is an unlikely differential diagnostic consideration. 3. Chronic and incidental findings include mild supraspinatus tendinopathy, mild infraspinatus muscle atrophy, curved acromial undersurface, and mildly widened acromioclavicular joint. Electronically signed by: Ryan Salvage MD 03/20/2024 11:48 AM EST RP Workstation: [id number]    ASSESSMENT & PLAN Adam [name] is a 69 y.o. male who presents to the rapid diagnostic clinic for evaluation of lytic lesion of the right scapula.   #Lytic lesion of right scapula: --MRI right shoulder from 03/19/24 showed Lytic expansile destructive enhancing 5.1 x 3.2 x 3.8 cm right scapular mass with extraosseous extension, glenoid and glenoid neck involvement, periostitis, and diffuse abnormal scapular marrow enhancement, with additional abnormal distal clavicular marrow enhancement suspicious for multifocal osseous involvement. --Differentials include plasamcytoma +/- multiple myeloma, metastatic lesion and sarcoma.  --Labs today to check CBC, CMP, SPEP/IFE, serum free light chains, PSA levels --Patient underwent CT imaging yesterday with urology. Will obtain records to review for other suspicious  lesions --Need core needle biopsy for tissue confirmation. Will order once CT scan and lab results are reviewed. --RTC once workup is complete  #Right shoulder pain: --Currently controlled with Norco 5-325 mg and Baclofen  5 mg.  --Advised to follow up for refills until diagnosis is made.   #H/O right renal cell carcinoma: --S/P partial nephrectomy in 2017  #Age Related Cancer Screenings: --Colonoscopy or Cologuard (M/F > 45):  colonoscopy in 2024 --PSA (M > 45):  03/2023 = 4.4    Orders Placed This Encounter  Procedures   CBC with Differential (Cancer Center Only)    Standing Status:   Future    Expiration Date:   04/10/2025   CMP (Cancer Center only)    Standing Status:   Future    Expiration Date:   04/10/2025   Multiple Myeloma Panel (SPEP&IFE w/QIG)    Standing Status:   Future    Expiration Date:   04/10/2025   Kappa/lambda light chains    Standing Status:   Future    Expiration Date:   04/10/2025   PSA (For [facility])    Standing Status:   Future    Expiration Date:   04/10/2025    All questions were answered. The patient knows to call the clinic with any problems, questions or concerns.  I have spent a total of 60 minutes minutes of face-to-face and non-face-to-face time, preparing to see the patient, obtaining and/or reviewing separately obtained history, performing a medically appropriate examination, counseling and educating the patient, ordering medications/tests/procedures, referring and communicating with other health care professionals, documenting clinical information in the electronic health record, independently interpreting results and communicating results to the patient, and care coordination.   Johnston Police, PA-C Department of Hematology/Oncology [facility] Phone: [phone] "

## 2024-04-10 NOTE — Progress Notes (Signed)
 Rapid Diagnostic Services   Patient presented to clinic, with spouse, for his scheduled appointment with PA-C Johnston. I introduced myself and provided them with my direct contact information. Patient and spouse were both encouraged to call me with any questions/concerns they may have.  Images of CT Abdomen completed with [facility] on 04/09/2024 were requested from [facility].  Colene KYM Raider, RN, BSN Oncology Nurse Navigator, Rapid Diagnostic Services 04/10/2024 3:31 PM

## 2024-04-10 NOTE — Patient Instructions (Signed)
 Rapid Diagnostic Service Visit Discharge Information and Instructions  Thank you for choosing [facility] for your healthcare needs.  Below is a summary of today's discussion, along with our contact information and an outline of what to expect next.  Reason for Visit:  Lytic lesion of right scapula  Proposed Diagnostic Care Plan: Labs today Needle biopsy  What to Expect: - Generally, when lab tests are ordered the results can take up to 1 week for results to be available.  At that point, we will contact you to discuss your results with you.  Unless there is a critical result, we will typically wait for all of your lab results to be available before contacting you. - If a biopsy is part of your Care Plan, those results can take on average 7-10 days to result.  Once results are available, we will contact you to discuss your pathology results and any next steps. - If you have additional imaging ordered, such as a CT Scan, MRI, Ultrasound, Bone Scan, or PET scan, your imaging will need to be authorized then scheduled with the earliest available appointment.  You may be asked to travel to another hospital within [facility] who has a sooner availability, please consider doing so if asked. - If you use MyChart, your results will be available to you in the MyChart portal.  Your provider will be in touch with you as soon as all of your results are available to be discussed.  Your Diagnostic Clinic Provider:  Johnston Police PA-C and Dr. Norleen Kidney, contact number [phone] Your Diagnostic Navigator:  Colene Raider RN, contact number [phone]  If you or your caregiver have number blocking on your cell phones, please ensure the cancer center's numbers are not blocked.  If you are not a registered MyChart user, please consider enrolling in MyChart to receive your test results and visit notes.  You can also access your discharge instructions electronically.  MyChart also gives you an electronic  means to communicate with your Care Team instead of needing to call in to the cancer center.  We appreciate you trusting us  with your healthcare and look forward to partnering with you as we work to uncover what your potential diagnosis may be.  Please do not hesitate to reach out at any point with questions or concerns.

## 2024-04-13 ENCOUNTER — Other Ambulatory Visit (HOSPITAL_COMMUNITY): Payer: Self-pay | Admitting: Physician Assistant

## 2024-04-13 ENCOUNTER — Encounter: Payer: Self-pay | Admitting: Medical Oncology

## 2024-04-13 DIAGNOSIS — D509 Iron deficiency anemia, unspecified: Secondary | ICD-10-CM

## 2024-04-13 LAB — KAPPA/LAMBDA LIGHT CHAINS
Kappa free light chain: 189 mg/L — ABNORMAL HIGH (ref 3.3–19.4)
Kappa, lambda light chain ratio: 49.74 — ABNORMAL HIGH (ref 0.26–1.65)
Lambda free light chains: 3.8 mg/L — ABNORMAL LOW (ref 5.7–26.3)

## 2024-04-13 NOTE — Progress Notes (Signed)
 Rapid Diagnostic Services   Call to patient to see about scheduling him today for additional labs, per [employer]. Patient stated he was out of town today and inquired as to why additional labs. Informed patient that his hemoglobin is low (7.4) and if he agrees to blood transfusion, we can schedule him tomorrow. Patient stated he was feeling all tore up with all this and informed me he will call me in the morning. I gave patient my understanding. Patient encouraged to call me with questions/concerns.   Colene KYM Raider, RN, BSN Oncology Nurse Navigator, Rapid Diagnostic Services 04/13/2024 2:30 PM

## 2024-04-14 ENCOUNTER — Other Ambulatory Visit: Payer: Self-pay

## 2024-04-14 ENCOUNTER — Telehealth: Payer: Self-pay

## 2024-04-14 ENCOUNTER — Other Ambulatory Visit (HOSPITAL_COMMUNITY): Payer: Self-pay

## 2024-04-14 DIAGNOSIS — D509 Iron deficiency anemia, unspecified: Secondary | ICD-10-CM

## 2024-04-14 MED ORDER — IPRATROPIUM BROMIDE 0.03 % NA SOLN
2.0000 | Freq: Two times a day (BID) | NASAL | 1 refills | Status: AC
  Filled 2024-04-14: qty 30, 30d supply, fill #0

## 2024-04-14 NOTE — Telephone Encounter (Signed)
 T/C from pt's wife regarding a message that was left for Adam [name] stating his Hgb was 7.4 and he needed a blood transfusion.    Pt has a cough, sore throat, cold symptoms but no fever.  He needs to come in for additional labs and 1 unit of blood.  OK to schedule with him having these symptoms?  Please advise

## 2024-04-15 ENCOUNTER — Inpatient Hospital Stay

## 2024-04-15 ENCOUNTER — Encounter: Payer: Self-pay | Admitting: Physician Assistant

## 2024-04-15 ENCOUNTER — Other Ambulatory Visit: Payer: Self-pay | Admitting: Physician Assistant

## 2024-04-15 DIAGNOSIS — D509 Iron deficiency anemia, unspecified: Secondary | ICD-10-CM

## 2024-04-15 DIAGNOSIS — R937 Abnormal findings on diagnostic imaging of other parts of musculoskeletal system: Secondary | ICD-10-CM

## 2024-04-15 DIAGNOSIS — R7689 Other specified abnormal immunological findings in serum: Secondary | ICD-10-CM

## 2024-04-15 DIAGNOSIS — M899 Disorder of bone, unspecified: Secondary | ICD-10-CM

## 2024-04-15 LAB — MULTIPLE MYELOMA PANEL, SERUM
Albumin SerPl Elph-Mcnc: 3.8 g/dL (ref 2.9–4.4)
Albumin/Glob SerPl: 0.5 — ABNORMAL LOW (ref 0.7–1.7)
Alpha 1: 0.2 g/dL (ref 0.0–0.4)
Alpha2 Glob SerPl Elph-Mcnc: 0.6 g/dL (ref 0.4–1.0)
B-Globulin SerPl Elph-Mcnc: 6.4 g/dL — ABNORMAL HIGH (ref 0.7–1.3)
Gamma Glob SerPl Elph-Mcnc: 0.9 g/dL (ref 0.4–1.8)
Globulin, Total: 8.1 g/dL — ABNORMAL HIGH (ref 2.2–3.9)
IgA: >6400 mg/dL — ABNORMAL HIGH (ref 61–437)
IgG (Immunoglobin G), Serum: 283 mg/dL — ABNORMAL LOW (ref 603–1613)
IgM (Immunoglobulin M), Srm: <5 mg/dL — ABNORMAL LOW (ref 20–172)
M Protein SerPl Elph-Mcnc: 5.4 g/dL — ABNORMAL HIGH
Total Protein ELP: 11.9 g/dL — ABNORMAL HIGH (ref 6.0–8.5)

## 2024-04-15 LAB — CBC WITH DIFFERENTIAL (CANCER CENTER ONLY)
Abs Immature Granulocytes: 0.02 K/uL (ref 0.00–0.07)
Basophils Absolute: 0 K/uL (ref 0.0–0.1)
Basophils Relative: 1 %
Eosinophils Absolute: 0.2 K/uL (ref 0.0–0.5)
Eosinophils Relative: 4 %
HCT: 22.8 % — ABNORMAL LOW (ref 39.0–52.0)
Hemoglobin: 7.3 g/dL — ABNORMAL LOW (ref 13.0–17.0)
Immature Granulocytes: 1 %
Lymphocytes Relative: 43 %
Lymphs Abs: 1.9 K/uL (ref 0.7–4.0)
MCH: 24.3 pg — ABNORMAL LOW (ref 26.0–34.0)
MCHC: 32 g/dL (ref 30.0–36.0)
MCV: 75.7 fL — ABNORMAL LOW (ref 80.0–100.0)
Monocytes Absolute: 0.2 K/uL (ref 0.1–1.0)
Monocytes Relative: 6 %
Neutro Abs: 2.1 K/uL (ref 1.7–7.7)
Neutrophils Relative %: 45 %
Platelet Count: 155 K/uL (ref 150–400)
RBC: 3.01 MIL/uL — ABNORMAL LOW (ref 4.22–5.81)
RDW: 25 % — ABNORMAL HIGH (ref 11.5–15.5)
WBC Count: 4.4 K/uL (ref 4.0–10.5)
nRBC: 0 % (ref 0.0–0.2)

## 2024-04-15 LAB — IRON AND IRON BINDING CAPACITY (CC-WL,HP ONLY)
Iron: 72 ug/dL (ref 45–182)
Saturation Ratios: 34 % (ref 17.9–39.5)
TIBC: 209 ug/dL — ABNORMAL LOW (ref 250–450)
UIBC: 137 ug/dL

## 2024-04-15 LAB — FERRITIN: Ferritin: 1345 ng/mL — ABNORMAL HIGH (ref 24–336)

## 2024-04-15 LAB — SAMPLE TO BLOOD BANK

## 2024-04-15 LAB — PREPARE RBC (CROSSMATCH)

## 2024-04-15 LAB — VITAMIN B12: Vitamin B-12: 197 pg/mL (ref 180–914)

## 2024-04-15 LAB — FOLATE: Folate: 3.4 ng/mL — ABNORMAL LOW

## 2024-04-15 MED ORDER — DIPHENHYDRAMINE HCL 25 MG PO CAPS
25.0000 mg | ORAL_CAPSULE | Freq: Once | ORAL | Status: AC
  Administered 2024-04-15: 25 mg via ORAL
  Filled 2024-04-15: qty 1

## 2024-04-15 MED ORDER — SODIUM CHLORIDE 0.9% IV SOLUTION
250.0000 mL | INTRAVENOUS | Status: DC
  Administered 2024-04-15: 100 mL via INTRAVENOUS

## 2024-04-15 MED ORDER — ACETAMINOPHEN 325 MG PO TABS
650.0000 mg | ORAL_TABLET | Freq: Once | ORAL | Status: AC
  Administered 2024-04-15: 650 mg via ORAL
  Filled 2024-04-15: qty 2

## 2024-04-15 NOTE — Progress Notes (Signed)
 I spoke to Mr. Adam [name] prior to his planned blood transfusion today. I reviewed his lab results from 04/10/2024 that showed acute microcytic anemia with Hgb 7.4, MCV 76.2. The total protein was >12.0, creatinine 1.43 and elevated kappa light chains and ratio.   Findings are most concerning for plasmacytoma +/- multiple myeloma. Recommend a PET scan to evaluate for other target lesions and proceed with a bone + bone marrow biopsy.   Adam [name] was agreeable to the plan.

## 2024-04-15 NOTE — Patient Instructions (Signed)
 Blood Transfusion, Adult A blood transfusion is a procedure in which you receive blood through an IV tube. You may need this procedure because of: A bleeding disorder. An illness. An injury. A surgery. The blood may come from someone else (a donor). You may also be able to donate blood for yourself before a surgery. The blood given in a transfusion may be made up of different types of cells. You may get: Red blood cells. These carry oxygen to the cells in the body. Platelets. These help your blood to clot. Plasma. This is the liquid part of your blood. It carries proteins and other substances through the body. White blood cells. These help you fight infections. If you have a clotting disorder, you may also get other types of blood products. Depending on the type of blood product, this procedure may take 1-4 hours to complete. Tell your doctor about: Any bleeding problems you have. Any reactions you have had during a blood transfusion in the past. Any allergies you have. All medicines you are taking, including vitamins, herbs, eye drops, creams, and over-the-counter medicines. Any surgeries you have had. Any medical conditions you have. Whether you are pregnant or may be pregnant. What are the risks? Talk with your health care provider about risks. The most common problems include: A mild allergic reaction. This includes red, swollen areas of skin (hives) and itching. Fever or chills. This may be the body's response to new blood cells received. This may happen during or up to 4 hours after the transfusion. More serious problems may include: A serious allergic reaction. This includes breathing trouble or swelling around the face and lips. Too much fluid in the lungs. This may cause breathing problems. Lung injury. This causes breathing trouble and low oxygen in the blood. This can happen within hours of the transfusion or days later. Too much iron . This can happen after getting many blood  transfusions over a period of time. An infection or virus passed through the blood. This is rare. Donated blood is carefully tested before it is given. Your body's defense system (immune system) trying to attack the new blood cells. This is rare. Symptoms may include fever, chills, nausea, low blood pressure, and low back or chest pain. Donated cells attacking healthy tissues. This is rare. What happens before the procedure? You will have a blood test to find out your blood type. The test also finds out what type of blood your body will accept and matches it to the donor type. If you are going to have a planned surgery, you may be able to donate your own blood. This may be done in case you need a transfusion. You will have your temperature, blood pressure, and pulse checked. You may receive medicine to help prevent an allergic reaction. This may be done if you have had a reaction to a transfusion before. This medicine may be given to you by mouth or through an IV tube. What happens during the procedure?  An IV tube will be put into one of your veins. The bag of blood will be attached to your IV tube. Then, the blood will enter through your vein. Your temperature, blood pressure, and pulse will be checked often. This is done to find early signs of a transfusion reaction. Tell your nurse right away if you have any of these symptoms: Shortness of breath or trouble breathing. Chest or back pain. Fever or chills. Red, swollen areas of skin or itching. If you have any signs  or symptoms of a reaction, your transfusion will be stopped. You may also be given medicine. When the transfusion is finished, your IV tube will be taken out. Pressure may be put on the IV site for a few minutes. A bandage (dressing) will be put on the IV site. The procedure may vary among doctors and hospitals. What happens after the procedure? You will be monitored until you leave the hospital or clinic. This includes  checking your temperature, blood pressure, pulse, breathing rate, and blood oxygen level. Your blood may be tested to see how you have responded to the transfusion. You may be warmed with fluids or blankets. This is done to keep the temperature of your body normal. If you have your procedure in an outpatient setting, you will be told whom to contact to report any reactions. Where to find more information Visit the [employer]: redcross.org Summary A blood transfusion is a procedure in which you receive blood through an IV tube. The blood you are given may be made up of different blood cells. You may receive red blood cells, platelets, plasma, or white blood cells. Your temperature, blood pressure, and pulse will be checked often. After the procedure, your blood may be tested to see how you have responded. This information is not intended to replace advice given to you by your health care provider. Make sure you discuss any questions you have with your health care provider. Document Revised: 06/16/2021 Document Reviewed: 06/16/2021 Elsevier Patient Education  2024 [employer].

## 2024-04-16 ENCOUNTER — Other Ambulatory Visit: Payer: Self-pay | Admitting: Physician Assistant

## 2024-04-16 ENCOUNTER — Encounter: Payer: Self-pay | Admitting: Medical Oncology

## 2024-04-16 ENCOUNTER — Encounter: Payer: Self-pay | Admitting: Radiology

## 2024-04-16 DIAGNOSIS — M899 Disorder of bone, unspecified: Secondary | ICD-10-CM

## 2024-04-16 LAB — TYPE AND SCREEN
ABO/RH(D): B NEG
Antibody Screen: NEGATIVE
Unit division: 0

## 2024-04-16 LAB — BPAM RBC
Blood Product Expiration Date: 202602182359
ISSUE DATE / TIME: 202601141513
Unit Type and Rh: 9500

## 2024-04-16 MED ORDER — FOLIC ACID 1 MG PO TABS: 1.0000 mg | ORAL_TABLET | Freq: Every day | ORAL | 2 refills | Status: AC

## 2024-04-16 MED ORDER — VITAMIN B-12 1000 MCG PO TABS: 1000.0000 ug | ORAL_TABLET | Freq: Every day | ORAL | 2 refills | Status: AC

## 2024-04-16 NOTE — Progress Notes (Signed)
 Rapid Diagnostic Services  Call to patient to inform him that his folic acid  and B12 are below normal. Per PA-C Johnston, a prescription for folic acid  and B12 have been sent to his pharmacy. Patient is take 1 mg by mouth daily of folic acid  and 1000 mcg of B12 daily. Patient gave verbal understanding. Denies questions at this time.   Colene KYM Raider, RN, BSN Oncology Nurse Navigator, Rapid Diagnostic Services 04/16/2024 4:07 PM

## 2024-04-16 NOTE — Progress Notes (Signed)
 Adam Sieving, MD  Adam [name]; Federico Norleen Adam MADISON, MD Cc: Daralene Ferol FALCON, RT; Lin Delon CROME Sounds good will review PET for targeted bone lesion options.  DDH       Previous Messages    ----- Message ----- From: Adam [name] Sent: 04/16/2024  11:01 AM EST To: [name], MD; Delon CROME Lin; Sharlot* Subject: RE: CT BIOPSY                                  Dr. 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  We need both biopsies (bone lesion and bone marrow) as the treatment is different if it was plasmacytoma versus plasmacytoma + multiple myeloma.  A PET scan is scheduled for tomorrow to look for other target lesions. Can you review and let us  know if there is another target lesion to biopsy?  Thanks, Johnston ----- Message ----- From: Adam Sieving, MD Sent: 04/16/2024  10:54 AM EST To: Delon CROME Lin; Ferol FALCON Daralene, RT; Iren* Subject: RE: CT BIOPSY                                  If we think this is probably a met, we need to assess for additional   lesions that might be safer for biopsy.  (Eg bone scan, PET, or bone survey etc)  If we think this is a primary bone tumor, we would need ortho onc consult to best plan biopsy trajectory.  Based on MRI, met was top on differential dx.  Let us  know how you would like to proceed.  Thanks [name] MD IR ----- Message ----- From: Daralene Ferol FALCON, RT Sent: 04/16/2024  10:28 AM EST To: Ir Procedure Requests Subject: CT BIOPSY                                      Procedure :CT BIOPSY  Reason :scapular bone lesion biopsy Dx: Bone lesion [M89.9 (ICD-10-CM)]  History :CT OUTSIDE FILMS BODY/ABD/PELVIS (Accession [id number]) (Order [id number]), MR SHOULDER RIGHT W WO CONTRAST (Accession [id number]) (Order [id number]), DG Shoulder Right (Accession [id number]) (Order [id number])  Provider:Neomi Johnston ONEIDA,  PA-C  Provider contact ; [phone]  PT is having a PET and BMBX on 01/26 - would there be anyway to work this ct bx in with the bmbx?

## 2024-04-17 ENCOUNTER — Other Ambulatory Visit (HOSPITAL_COMMUNITY): Payer: Self-pay

## 2024-04-17 ENCOUNTER — Encounter (HOSPITAL_COMMUNITY)
Admission: RE | Admit: 2024-04-17 | Discharge: 2024-04-17 | Disposition: A | Discharge status: Home / Self Care | Source: Ambulatory Visit | Attending: Physician Assistant | Admitting: Physician Assistant

## 2024-04-17 DIAGNOSIS — R937 Abnormal findings on diagnostic imaging of other parts of musculoskeletal system: Secondary | ICD-10-CM | POA: Insufficient documentation

## 2024-04-17 DIAGNOSIS — M899 Disorder of bone, unspecified: Secondary | ICD-10-CM | POA: Diagnosis present

## 2024-04-17 LAB — GLUCOSE, CAPILLARY: Glucose-Capillary: 126 mg/dL — ABNORMAL HIGH (ref 70–99)

## 2024-04-17 LAB — METHYLMALONIC ACID, SERUM: Methylmalonic Acid, Quantitative: 300 nmol/L (ref 0–378)

## 2024-04-17 MED ORDER — FLUDEOXYGLUCOSE F - 18 (FDG) INJECTION
8.3100 | Freq: Once | INTRAVENOUS | Status: AC | PRN
  Administered 2024-04-17: 8.31 via INTRAVENOUS

## 2024-04-20 ENCOUNTER — Encounter: Payer: Self-pay | Admitting: Physician Assistant

## 2024-04-20 ENCOUNTER — Other Ambulatory Visit (HOSPITAL_COMMUNITY): Payer: Self-pay

## 2024-04-21 ENCOUNTER — Encounter: Payer: Self-pay | Admitting: Urology

## 2024-04-21 ENCOUNTER — Other Ambulatory Visit (HOSPITAL_COMMUNITY): Payer: Self-pay

## 2024-04-21 ENCOUNTER — Other Ambulatory Visit (HOSPITAL_COMMUNITY): Payer: Self-pay | Admitting: Radiology

## 2024-04-21 DIAGNOSIS — M898X9 Other specified disorders of bone, unspecified site: Secondary | ICD-10-CM

## 2024-04-21 NOTE — H&P (Signed)
 "     Chief Complaint: Patient was seen in consultation today for bone lesion  Referring Physician(s): Neomi Johnston DASEN  Supervising Physician: Hughes Simmonds  Patient Status: [facility]  History of Present Illness: Adam [name] is a 69 y.o. male with history of BPH, chronic hep C recently found to have abnormal labs most concerning for plasmacytoma +/- multiple myeloma.    PET 04/17/24 showed: 1. Expansile lytic right scapular mass, maximum SUV 4.0, compatible with malignancy , possibilities may include plasmacytoma, sarcoma, or giant cell tumor. No definite primary lesion observed for this to represent a metastatic lesion, although the patient does have a history of prior right renal cell carcinoma. 2. Small left upper hilar node, maximum SUV 4.4, and right hilar/infrahilar node, maximum SUV 4.1. These lymph nodes are mildly hypermetabolic but could be reactive. Surveillance suggested. 3. Atelectasis or scarring in the right lower lobe. 4. Systemic atherosclerosis, including thoracic aortic, coronary artery, aortic, and iliac arterial calcifications. 5. Degenerative left glenohumeral arthropathy with degenerative subcortical cysts in the left glenoid. 6. Chronic right maxillary sinusitis.  He is referred to [facility] for scapular bone lesion biopsy.   Adam [name] presents today in his usual state of health.  He has been NPO.  He does not take blood thinners.  *** He has post-procedure care and transportation arranged.  He is FULL CODE.  Past Medical History:  Diagnosis Date   Anxiety    with some things   Arthritis    BPH (benign prostatic hyperplasia)    Chronic hepatitis C without hepatic coma (HCC) 08/16/2014   finished treatment 4-5 mos ago, CMET in EPIC 08-11-15   H/O knee surgery 08/16/2014   Hepatitis C    Hyperlipidemia 08/16/2014   IGT (impaired glucose tolerance)    Lung nodule    left lower lobe, unchanged in 3 years as of 01/2019   Right renal mass      Past Surgical History:  Procedure Laterality Date   CHOLECYSTECTOMY N/A 08/12/2015   Procedure: LAPAROSCOPIC CHOLECYSTECTOMY WITH INTRAOPERATIVE CHOLANGIOGRAM;  Surgeon: Lynda Leos, MD;  Location: [facility] OR;  Service: General;  Laterality: N/A;   KNEE ARTHROSCOPY Bilateral    OPEN REDUCTION INTERNAL FIXATION (ORIF) DISTAL RADIAL FRACTURE Right 10/26/2015   Procedure: OPEN REDUCTION INTERNAL FIXATION (ORIF) DISTAL RADIAL FRACTURE;  Surgeon: Donnice Robinsons, MD;  Location: [facility];  Service: Orthopedics;  Laterality: Right;   ROBOTIC ASSITED PARTIAL NEPHRECTOMY Right 01/27/2016   Procedure: XI ROBOTIC ASSITED PARTIAL NEPHRECTOMY;  Surgeon: Belvie LITTIE Clara, MD;  Location: [facility];  Service: Urology;  Laterality: Right;    Allergies: Patient has no known allergies.  Medications: Prior to Admission medications  Medication Sig Start Date End Date Taking? Authorizing Provider  atorvastatin  (LIPITOR) 80 MG tablet Take by mouth daily at 6 PM. 01/22/18   [provider]  Baclofen  5 MG TABS Take 1 tablet (5 mg total) by mouth at bedtime as needed. 02/28/24   Raspet, Erin K, PA-C  cyanocobalamin (VITAMIN B12) 1000 MCG tablet Take 1 tablet (1,000 mcg total) by mouth daily. 04/16/24   Neomi Johnston DASEN, PA-C  empagliflozin  (JARDIANCE ) 10 MG TABS tablet Take 1 tablet (10 mg total) by mouth daily. 10/01/23     folic acid  (FOLVITE ) 1 MG tablet Take 1 tablet (1 mg total) by mouth daily. 04/16/24   Neomi Johnston DASEN, PA-C  HYDROcodone -acetaminophen  (NORCO/VICODIN) 5-325 MG tablet Take 1 tablet by mouth 3 (three) times daily as needed for pain suspected  to be related to cancer. 04/01/24     ipratropium (ATROVENT ) 0.03 % nasal spray Place 2 sprays into both nostrils 2 (two) times daily. 04/14/24     lidocaine  (XYLOCAINE ) 5 % ointment Apply 1 Application topically as needed. 02/28/24   Raspet, Erin K, PA-C  lisinopril  (ZESTRIL ) 10 MG tablet TAKE 1 TABLET BY MOUTH ONCE A DAY FOR KIDNEY  PROTECTION 10/20/21     lisinopril  (ZESTRIL ) 10 MG tablet Take 1 tablet (10 mg total) by mouth daily. 03/06/24     metFORMIN  (GLUCOPHAGE ) 500 MG tablet Take 1 tablet (500 mg total) by mouth 2 (two) times daily. 03/06/24        Family History  Problem Relation Age of Onset   Heart disease Mother    Hyperlipidemia Mother    Hypertension Father    Cancer Father    Dementia Father    Diabetes Sister     Social History   Socioeconomic History   Marital status: Married    Spouse name: Not on file   Number of children: Not on file   Years of education: Not on file   Highest education level: Not on file  Occupational History   Not on file  Tobacco Use   Smoking status: Never   Smokeless tobacco: Never  Vaping Use   Vaping status: Never Used  Substance and Sexual Activity   Alcohol use: No   Drug use: Yes    Types: Marijuana    Comment: last joint 01/24/16   Sexual activity: Yes  Other Topics Concern   Not on file  Social History Narrative   Not on file   Social Drivers of Health   Tobacco Use: Low Risk (04/10/2024)   Patient History    Smoking Tobacco Use: Never    Smokeless Tobacco Use: Never    Passive Exposure: Not on file  Financial Resource Strain: Not on file  Food Insecurity: Not on file  Transportation Needs: Not on file  Physical Activity: Not on file  Stress: Not on file  Social Connections: Not on file  Depression (EYV7-0): Not on file  Alcohol Screen: Not on file  Housing: Not on file  Utilities: Not on file  Health Literacy: Not on file     Review of Systems: A 12 point ROS discussed and pertinent positives are indicated in the HPI above.  All other systems are negative.  Review of Systems  Vital Signs: There were no vitals taken for this visit.  Physical Exam       Imaging: NM PET Image Initial (PI) Whole Body Result Date: 04/17/2024 EXAM: PET CT WHOLE BODY 04/17/2024 10:47:56 AM TECHNIQUE: RADIOPHARMACEUTICAL: 8.31 mCi F-18 FDG Uptake  time 60 minutes. Glucose level 126 mg/dl. Blood pool SUV 2.1. The expansile lytic right scapular mass has a maximum SUV of 4.0. A small left upper hilar node has a maximum SUV of 4.4. A right hilar/infrahilar node has a maximum SUV of 4.1. PET imaging was acquired from the skull vertex through the feet. Non-contrast enhanced computed tomography was obtained for attenuation correction and anatomic localization. COMPARISON: MRI right shoulder 03/19/2024. CLINICAL HISTORY: Bone lesion (expansile destructive right scapular mass) concerning for a plasmacytoma/multiple myeloma; history of right renal cell carcinoma. FINDINGS: HEAD AND NECK: Chronic right maxillary sinusitis. No metabolically active cervical lymphadenopathy. CHEST: A small left upper hilar node has a maximum SUV of 4.4. A right hilar/infrahilar node has a maximum SUV of 4.1. Atelectasis or scarring in the right lower lobe. Thoracic  aortic, coronary artery, and branch vessel atheromatous vascular calcifications. No metabolically active pulmonary nodules. ABDOMEN AND PELVIS: Systemic atherosclerosis is present, including the aorta and iliac arteries. Colon diverticulosis. Prostatomegaly. No metabolically active intraperitoneal mass. No metabolically active lymphadenopathy. Physiologic activity within the gastrointestinal and genitourinary systems. BONES AND SOFT TISSUE: The expansile lytic right scapular mass has a maximum SUV of 4.0. Chronic lucency in the left iliac bone on image 181 series 4, not hypermetabolic, likely a hemangioma or similar benign lesion. Degenerative left glenohumeral arthropathy with degenerative subcortical cysts in the left glenoid. No other abnormal FDG activity localizes to the bones. IMPRESSION: 1. Expansile lytic right scapular mass, maximum SUV 4.0, compatible with malignancy , possibilities may include plasmacytoma, sarcoma, or giant cell tumor. No definite primary lesion observed for this to represent a metastatic lesion,  although the patient does have a history of prior right renal cell carcinoma. 2. Small left upper hilar node, maximum SUV 4.4, and right hilar/infrahilar node, maximum SUV 4.1. These lymph nodes are mildly hypermetabolic but could be reactive. Surveillance suggested. 3. Atelectasis or scarring in the right lower lobe. 4. Systemic atherosclerosis, including thoracic aortic, coronary artery, aortic, and iliac arterial calcifications. 5. Degenerative left glenohumeral arthropathy with degenerative subcortical cysts in the left glenoid. 6. Chronic right maxillary sinusitis. 7. Chronic lucency in the left iliac bone, likely a hemangioma or similar benign lesion. 8. Colon diverticulosis. 9. Prostatomegaly. Electronically signed by: Ryan Salvage MD 04/17/2024 11:19 AM EST RP Workstation: [id number]    Labs:  CBC: Recent Labs    04/10/24 1513 04/15/24 1301  WBC 5.1 4.4  HGB 7.4* 7.3*  HCT 24.0* 22.8*  PLT 154 155    COAGS: No results for input(s): INR, APTT in the last 8760 hours.  BMP: Recent Labs    04/10/24 1513  NA 140  K 4.1  CL 100  CO2 22  GLUCOSE 90  BUN 17  CALCIUM  9.9  CREATININE 1.43*  GFRNONAA 53*    LIVER FUNCTION TESTS: Recent Labs    04/10/24 1513  BILITOT 0.3  AST 18  ALT 9  ALKPHOS 63  PROT >12.0*  ALBUMIN 3.4*    TUMOR MARKERS: No results for input(s): AFPTM, CEA, CA199, CHROMGRNA in the last 8760 hours.  Assessment and Plan: Patient with past medical history of *** presents with complaint of ***.  IR consulted for *** at the request of ***. Case reviewed by Dr. PIERRETTE who approves patient for procedure.  Patient presents today in their usual state of health.  *** has been NPO and is not currently on blood thinners.   Risks and benefits of *** was discussed with the patient and/or patient's family including, but not limited to bleeding, infection, damage to adjacent structures or low yield requiring additional tests.  All of the  questions were answered and there is agreement to proceed.  Consent signed and in chart.   Thank you for this interesting consult.  I greatly enjoyed meeting Roemello W Schrader and look forward to participating in their care.  A copy of this report was sent to the requesting provider on this date.  Electronically Signed: Jatavious Peppard Sue-Ellen Yailine Ballard, PA 04/21/2024, 10:12 PM   I spent a total of {New PWEU:[id number]} {New Out-Pt:[id number]}  {Established Out-Pt:[id number]} in face to face in clinical consultation, greater than 50% of which was counseling/coordinating care for ***  "

## 2024-04-22 ENCOUNTER — Other Ambulatory Visit (HOSPITAL_COMMUNITY): Payer: Self-pay

## 2024-04-22 ENCOUNTER — Ambulatory Visit (HOSPITAL_COMMUNITY)
Admission: RE | Admit: 2024-04-22 | Discharge: 2024-04-22 | Disposition: A | Discharge status: Home / Self Care | Source: Ambulatory Visit | Attending: Physician Assistant | Admitting: Physician Assistant

## 2024-04-22 ENCOUNTER — Ambulatory Visit (HOSPITAL_COMMUNITY)
Admission: RE | Admit: 2024-04-22 | Discharge: 2024-04-22 | Disposition: A | Source: Ambulatory Visit | Attending: Physician Assistant

## 2024-04-22 ENCOUNTER — Other Ambulatory Visit: Payer: Self-pay

## 2024-04-22 DIAGNOSIS — Z8619 Personal history of other infectious and parasitic diseases: Secondary | ICD-10-CM | POA: Insufficient documentation

## 2024-04-22 DIAGNOSIS — R7689 Other specified abnormal immunological findings in serum: Secondary | ICD-10-CM | POA: Insufficient documentation

## 2024-04-22 DIAGNOSIS — M19012 Primary osteoarthritis, left shoulder: Secondary | ICD-10-CM | POA: Diagnosis not present

## 2024-04-22 DIAGNOSIS — C9000 Multiple myeloma not having achieved remission: Secondary | ICD-10-CM | POA: Diagnosis not present

## 2024-04-22 DIAGNOSIS — R918 Other nonspecific abnormal finding of lung field: Secondary | ICD-10-CM | POA: Insufficient documentation

## 2024-04-22 DIAGNOSIS — I700 Atherosclerosis of aorta: Secondary | ICD-10-CM | POA: Diagnosis not present

## 2024-04-22 DIAGNOSIS — J320 Chronic maxillary sinusitis: Secondary | ICD-10-CM | POA: Diagnosis not present

## 2024-04-22 DIAGNOSIS — M899 Disorder of bone, unspecified: Secondary | ICD-10-CM | POA: Insufficient documentation

## 2024-04-22 DIAGNOSIS — M898X9 Other specified disorders of bone, unspecified site: Secondary | ICD-10-CM

## 2024-04-22 LAB — CBC WITH DIFFERENTIAL/PLATELET
Abs Immature Granulocytes: 0.02 K/uL (ref 0.00–0.07)
Basophils Absolute: 0 K/uL (ref 0.0–0.1)
Basophils Relative: 1 %
Eosinophils Absolute: 0.2 K/uL (ref 0.0–0.5)
Eosinophils Relative: 6 %
HCT: 25.6 % — ABNORMAL LOW (ref 39.0–52.0)
Hemoglobin: 8 g/dL — ABNORMAL LOW (ref 13.0–17.0)
Immature Granulocytes: 1 %
Lymphocytes Relative: 43 %
Lymphs Abs: 1.9 K/uL (ref 0.7–4.0)
MCH: 24.8 pg — ABNORMAL LOW (ref 26.0–34.0)
MCHC: 31.3 g/dL (ref 30.0–36.0)
MCV: 79.5 fL — ABNORMAL LOW (ref 80.0–100.0)
Monocytes Absolute: 0.3 K/uL (ref 0.1–1.0)
Monocytes Relative: 7 %
Neutro Abs: 1.9 K/uL (ref 1.7–7.7)
Neutrophils Relative %: 42 %
Platelets: 159 K/uL (ref 150–400)
RBC: 3.22 MIL/uL — ABNORMAL LOW (ref 4.22–5.81)
RDW: 24.3 % — ABNORMAL HIGH (ref 11.5–15.5)
Smear Review: NORMAL
WBC: 4.4 K/uL (ref 4.0–10.5)
nRBC: 0 % (ref 0.0–0.2)

## 2024-04-22 LAB — GLUCOSE, CAPILLARY: Glucose-Capillary: 137 mg/dL — ABNORMAL HIGH (ref 70–99)

## 2024-04-22 MED ORDER — MIDAZOLAM HCL (PF) 2 MG/2ML IJ SOLN
INTRAMUSCULAR | Status: AC | PRN
  Administered 2024-04-22 (×4): 1 mg via INTRAVENOUS

## 2024-04-22 MED ORDER — HYDROCODONE-ACETAMINOPHEN 5-325 MG PO TABS: 2.0000 | ORAL_TABLET | ORAL | Status: DC | PRN

## 2024-04-22 MED ORDER — FENTANYL CITRATE (PF) 100 MCG/2ML IJ SOLN
INTRAMUSCULAR | Status: AC
  Filled 2024-04-22: qty 2

## 2024-04-22 MED ORDER — HYDROCODONE-ACETAMINOPHEN 5-325 MG PO TABS
1.0000 | ORAL_TABLET | Freq: Three times a day (TID) | ORAL | 0 refills | Status: DC | PRN
  Filled 2024-04-22: qty 40, 14d supply, fill #0

## 2024-04-22 MED ORDER — SODIUM CHLORIDE 0.9 % IV SOLN: INTRAVENOUS | Status: DC

## 2024-04-22 MED ORDER — MIDAZOLAM HCL 2 MG/2ML IJ SOLN
INTRAMUSCULAR | Status: AC
  Filled 2024-04-22: qty 2

## 2024-04-22 MED ORDER — FENTANYL CITRATE (PF) 100 MCG/2ML IJ SOLN
INTRAMUSCULAR | Status: AC | PRN
  Administered 2024-04-22 (×2): 50 ug via INTRAVENOUS

## 2024-04-22 NOTE — Sedation Documentation (Signed)
 Starting scapular bx

## 2024-04-22 NOTE — Discharge Instructions (Signed)
 Needle Biopsy   Please call Interventional Radiology clinic [phone] with any questions or concerns.  You may remove your dressing and shower tomorrow.  Needle Biopsy, Care After These instructions tell you how to care for yourself after your procedure. Your doctor may also give you more specific instructions. Call your doctor if you have any problems or questions. What can I expect after the procedure? After the procedure, it is common to have: Soreness. Bruising. Mild pain. Follow these instructions at home:  Return to your normal activities as told by your doctor. Ask your doctor what activities are safe for you. Take over-the-counter and prescription medicines only as told by your doctor. Wash your hands with soap and water  before you change your bandage (dressing). If you cannot use soap and water , use hand sanitizer. Follow instructions from your doctor about: How to take care of your puncture site. When and how to change your bandage. When to remove your bandage. Check your puncture site every day for signs of infection. Watch for: Redness, swelling, or pain. Fluid or blood.  Pus or a bad smell. Warmth. Do not take baths, swim, or use a hot tub until your doctor approves. Ask your doctor if you may take showers. You may only be allowed to take sponge baths. Keep all follow-up visits as told by your doctor. This is important. Contact a doctor if you have: A fever. Redness, swelling, or pain at the puncture site, and it lasts longer than a few days. Fluid, blood, or pus coming from the puncture site. Warmth coming from the puncture site. Get help right away if: You have a lot of bleeding from the puncture site. Summary After the procedure, it is common to have soreness, bruising, or mild pain at the puncture site. Check your puncture site every day for signs of infection, such as redness, swelling, or pain. Get help right away if you have severe bleeding from your  puncture site. This information is not intended to replace advice given to you by your health care provider. Make sure you discuss any questions you have with your health care provider. Document Revised: 04/01/2017 Document Reviewed: 04/01/2017 Elsevier Patient Education  2020 [employer].  Please call Interventional Radiology clinic [phone] with any questions or concerns.  You may remove your dressing and shower tomorrow.  After the procedure, it is common to have: Soreness Bruising Mild pain  Follow these instructions at home:  Medication: Do not use Aspirin or ibuprofen  products, such as Advil  or Motrin , as it may increase bleeding You may resume your usual medications as ordered by your doctor If your doctor prescribed antibiotics, take them as directed. Do not stop taking them just because you feel better. You need to take the full course of antibiotics  Eating and drinking: Drink plenty of liquids to keep your urine pale yellow You can resume your regular diet as directed by your doctor   Care of the procedure site  Check your biopsy site every day until it heals  Keep the bandage dry. You can take the bandage off and shower tomorrow  If you are bleeding from the biopsy site, apply firm pressure on the area for at least 30 minutes  If directed, apply ice to your biopsy site 2-3 times a day.  o Put ice in a plastic bag  o Place a towel between your skin and the ice bag  o Leave the ice in place for 20 minutes 2-3 times a day  Activity  Do not take baths, swim, or use a hot tub until your health care provider approves  Keep all follow-up visits as told by your doctor  Contact your doctor or seek immediate medical care if: You have bright red bleeding from the biopsy site that does not stop after 30 minutes of holding direct pressure on the site. You have signs of infection, such as: Increased pain, swelling, warmth, or redness at the biopsy site Red streaks leading  from the biopsy site Yellow or green drainage from the biopsy site A fever (temperature over 100.35F) chills, or both  Moderate Conscious Sedation-Care After  This sheet gives you information about how to care for yourself after your procedure. Your health care provider may also give you more specific instructions. If you have problems or questions, contact your health care provider.  After the procedure, it is common to have: Sleepiness for several hours. Impaired judgment for several hours. Difficulty with balance. Vomiting if you eat too soon.  Follow these instructions at home:  Rest. Do not participate in activities where you could fall or become injured. Do not drive or use machinery. Do not drink alcohol. Do not take sleeping pills or medicines that cause drowsiness. Do not make important decisions or sign legal documents. Do not take care of children on your own.  Eating and drinking Follow the diet recommended by your health care provider. Drink enough fluid to keep your urine pale yellow. If you vomit: Drink water , juice, or soup when you can drink without vomiting. Make sure you have little or no nausea before eating solid foods.  General instructions Take over-the-counter and prescription medicines only as told by your health care provider. Have a responsible adult stay with you for the time you are told. It is important to have someone help care for you until you are awake and alert. Do not smoke. Keep all follow-up visits as told by your health care provider. This is important.  Contact a health care provider if: You are still sleepy or having trouble with balance after 24 hours. You feel light-headed. You keep feeling nauseous or you keep vomiting. You develop a rash. You have a fever. You have redness or swelling around the IV site.  Get help right away if: You have trouble breathing. You have new-onset confusion at home.  This information is not  intended to replace advice given to you by your health care provider. Make sure you discuss any questions you have with your healthcare provider.

## 2024-04-22 NOTE — Procedures (Signed)
 Vascular and Interventional Radiology Procedure Note  Patient: Adam [name] DOB: 1955/11/25 Medical Record Number: [id number] Note Date/Time: 04/22/24 9:39 AM   Performing Physician: Thom Hall, MD Assistant(s): None  Diagnosis: R scapular bone lesion. Q MM   Procedure:  BONE MARROW ASPIRATION and BIOPSY RIGHT SCAPULAR BONE LESION BIOPSY  Anesthesia: Conscious Sedation Complications: None Estimated Blood Loss: Minimal Specimens: Sent for Cytology and Pathology  Findings:  Successful Ultrasound and CT-guided bone marrow aspiration and biopsy, then R scapular bone lesion Bx 5 mL of aspirate from the R scapular lesion obtain and submitted for cytology. Hemostasis of the tract was achieved using Manual Pressure.  Plan: Bed rest for 1 hours.  See detailed procedure note with images in PACS. The patient tolerated the procedure well without incident or complication and was returned to Recovery in stable condition.    Thom Hall, MD [facility]   Pager. [phone] Clinic. [phone]

## 2024-04-24 LAB — CYTOLOGY - NON PAP

## 2024-04-24 LAB — SURGICAL PATHOLOGY

## 2024-04-27 ENCOUNTER — Telehealth: Payer: Self-pay | Admitting: Physician Assistant

## 2024-04-27 ENCOUNTER — Encounter (HOSPITAL_COMMUNITY)

## 2024-04-27 NOTE — Telephone Encounter (Signed)
 Left a voicemail with the rescheduled appointment details due to the weather.

## 2024-04-28 ENCOUNTER — Inpatient Hospital Stay

## 2024-04-28 ENCOUNTER — Inpatient Hospital Stay: Admitting: Physician Assistant

## 2024-04-30 ENCOUNTER — Other Ambulatory Visit: Payer: Self-pay | Admitting: Physician Assistant

## 2024-04-30 ENCOUNTER — Encounter (HOSPITAL_COMMUNITY): Payer: Self-pay

## 2024-04-30 DIAGNOSIS — C9000 Multiple myeloma not having achieved remission: Secondary | ICD-10-CM

## 2024-05-01 ENCOUNTER — Encounter: Payer: Self-pay | Admitting: Medical Oncology

## 2024-05-01 ENCOUNTER — Inpatient Hospital Stay

## 2024-05-01 ENCOUNTER — Other Ambulatory Visit (HOSPITAL_COMMUNITY): Payer: Self-pay

## 2024-05-01 ENCOUNTER — Inpatient Hospital Stay: Admitting: Physician Assistant

## 2024-05-01 VITALS — BP 155/80 | HR 86 | Temp 99.6°F | Resp 18 | Ht 73.0 in | Wt 167.8 lb

## 2024-05-01 DIAGNOSIS — C9000 Multiple myeloma not having achieved remission: Secondary | ICD-10-CM

## 2024-05-01 DIAGNOSIS — M25511 Pain in right shoulder: Secondary | ICD-10-CM

## 2024-05-01 LAB — CBC WITH DIFFERENTIAL (CANCER CENTER ONLY)
Abs Immature Granulocytes: 0.03 10*3/uL (ref 0.00–0.07)
Basophils Absolute: 0 10*3/uL (ref 0.0–0.1)
Basophils Relative: 0 %
Eosinophils Absolute: 0.1 10*3/uL (ref 0.0–0.5)
Eosinophils Relative: 2 %
HCT: 24.2 % — ABNORMAL LOW (ref 39.0–52.0)
Hemoglobin: 7.9 g/dL — ABNORMAL LOW (ref 13.0–17.0)
Immature Granulocytes: 1 %
Lymphocytes Relative: 32 %
Lymphs Abs: 2.1 10*3/uL (ref 0.7–4.0)
MCH: 24.9 pg — ABNORMAL LOW (ref 26.0–34.0)
MCHC: 32.6 g/dL (ref 30.0–36.0)
MCV: 76.3 fL — ABNORMAL LOW (ref 80.0–100.0)
Monocytes Absolute: 0.3 10*3/uL (ref 0.1–1.0)
Monocytes Relative: 5 %
Neutro Abs: 4 10*3/uL (ref 1.7–7.7)
Neutrophils Relative %: 60 %
Platelet Count: 131 10*3/uL — ABNORMAL LOW (ref 150–400)
RBC: 3.17 MIL/uL — ABNORMAL LOW (ref 4.22–5.81)
RDW: 23.7 % — ABNORMAL HIGH (ref 11.5–15.5)
WBC Count: 6.5 10*3/uL (ref 4.0–10.5)
nRBC: 0 % (ref 0.0–0.2)

## 2024-05-01 LAB — CMP (CANCER CENTER ONLY)
ALT: 9 U/L (ref 0–44)
AST: 24 U/L (ref 15–41)
Albumin: 3.2 g/dL — ABNORMAL LOW (ref 3.5–5.0)
Alkaline Phosphatase: 60 U/L (ref 38–126)
Anion gap: 19 — ABNORMAL HIGH (ref 5–15)
BUN: 16 mg/dL (ref 8–23)
CO2: 21 mmol/L — ABNORMAL LOW (ref 22–32)
Calcium: 9.7 mg/dL (ref 8.9–10.3)
Chloride: 99 mmol/L (ref 98–111)
Creatinine: 1.29 mg/dL — ABNORMAL HIGH (ref 0.61–1.24)
GFR, Estimated: >60 mL/min
Glucose, Bld: 125 mg/dL — ABNORMAL HIGH (ref 70–99)
Potassium: 4 mmol/L (ref 3.5–5.1)
Sodium: 139 mmol/L (ref 135–145)
Total Bilirubin: 0.3 mg/dL (ref 0.0–1.2)
Total Protein: >12 g/dL — ABNORMAL HIGH (ref 6.5–8.1)

## 2024-05-01 LAB — SAMPLE TO BLOOD BANK

## 2024-05-01 MED ORDER — OXYCODONE HCL 5 MG PO TABS
5.0000 mg | ORAL_TABLET | ORAL | 0 refills | Status: DC | PRN
  Filled 2024-05-01: qty 30, 3d supply, fill #0

## 2024-05-01 NOTE — Progress Notes (Signed)
 Appointment with Johnston Police, PA, met with patient to introduce myself and that I was taking over from [place] in his care as Nurse Navigator. I provided the patient with my contact information and reviewed the Nurse Navigation information and clinic contact numbers. Verified enrollment in MyChart. I encouraged patient to contact me with any questions or concerns. Patient verbalized understanding, I will continue to follow.

## 2024-05-01 NOTE — Progress Notes (Signed)
" °  Rapid Diagnostic Service for [facility]  Diagnostic Nurse Navigator Treatment Team Hand-Off Note  05/01/24  Patient Name:  Adam [name] Patient MRN:  [id number] Patient DOB:  1955-10-23   Patient Care Team: Redmon, Noelle, PA as PCP - General (Nurse Practitioner) Hays Morrison, CRNP as Nurse Practitioner (Nurse Practitioner) Golden Forestine BROCKS, RN as Oncology Nurse Navigator (Medical Oncology) Elana Montie CROME, RN as Oncology Nurse Navigator  Chief Complaint Multiple Myeloma  Oncology History   No problem history exists.    Cancer Staging  No matching staging information was found for the patient.   SDOH Screening and Interventions Updated:  No  SDOH Screenings   Tobacco Use: Low Risk (04/10/2024)     Genetics Assessment Completed:  No Genetics Referral Made:  no  Care Team Updated:  Yes  Colene KYM Golden, RN, BSN Oncology Nurse Navigator, [facility] Services 05/01/2024 11:11 AM   "

## 2024-05-04 ENCOUNTER — Other Ambulatory Visit: Payer: Self-pay | Admitting: Hematology and Oncology

## 2024-05-04 DIAGNOSIS — C9000 Multiple myeloma not having achieved remission: Secondary | ICD-10-CM | POA: Insufficient documentation

## 2024-05-04 LAB — KAPPA/LAMBDA LIGHT CHAINS
Kappa free light chain: 176.2 mg/L — ABNORMAL HIGH (ref 3.3–19.4)
Kappa, lambda light chain ratio: 50.34 — ABNORMAL HIGH (ref 0.26–1.65)
Lambda free light chains: 3.5 mg/L — ABNORMAL LOW (ref 5.7–26.3)

## 2024-05-04 NOTE — Progress Notes (Signed)
 START ON PATHWAY REGIMEN - Multiple Myeloma and Other Plasma Cell Dyscrasias   DaraVRd (Daratumumab SUBQ + Bortezomib SUBQ I8,1,84,77 + Lenalidomide PO D1-21 + Dexamethasone IV/PO I8,1,84,77) q28 Days (Induction Schema):   Cycles 1 and 2: A cycle is every 28 days:     Lenalidomide      Dexamethasone      Bortezomib      Daratumumab and hyaluronidase-fihj    Cycles 3 and 4: A cycle is every 28 days:     Lenalidomide      Dexamethasone      Bortezomib      Daratumumab and hyaluronidase-fihj    DaraVRd (Daratumumab SUBQ + Bortezomib SUBQ I8,1,84,77 + Lenalidomide PO D1-21 + Dexamethasone IV/PO I8,1,84,77) q28 Days (Consolidation Schema):   A cycle is every 28 days:     Lenalidomide      Dexamethasone      Bortezomib      Daratumumab and hyaluronidase-fihj   **Always confirm dose/schedule in your pharmacy ordering system**  Patient Characteristics: Multiple Myeloma, Newly Diagnosed, Transplant Eligible, Unknown Risk or Awaiting Test Results Disease Classification: Multiple Myeloma Therapeutic Status: Newly Diagnosed R2-ISS Staging: II Is Patient Eligible for Transplant<= Transplant Eligible Risk Status: Awaiting Test Results Intent of Therapy: Curative Intent, Discussed with Patient

## 2024-05-05 ENCOUNTER — Ambulatory Visit
Admission: RE | Admit: 2024-05-05 | Discharge: 2024-05-05 | Disposition: A | Source: Ambulatory Visit | Attending: Radiation Oncology | Admitting: Radiation Oncology

## 2024-05-05 ENCOUNTER — Ambulatory Visit: Admission: RE | Admit: 2024-05-05 | Discharge: 2024-05-05 | Attending: Radiation Oncology

## 2024-05-05 ENCOUNTER — Telehealth: Payer: Self-pay | Admitting: Physician Assistant

## 2024-05-05 ENCOUNTER — Encounter: Payer: Self-pay | Admitting: Radiation Oncology

## 2024-05-05 ENCOUNTER — Other Ambulatory Visit (HOSPITAL_COMMUNITY): Payer: Self-pay

## 2024-05-05 ENCOUNTER — Ambulatory Visit
Admission: RE | Admit: 2024-05-05 | Discharge: 2024-05-05 | Attending: Radiation Oncology | Admitting: Radiation Oncology

## 2024-05-05 ENCOUNTER — Encounter: Payer: Self-pay | Admitting: Hematology and Oncology

## 2024-05-05 VITALS — BP 116/72 | HR 98 | Temp 98.3°F | Resp 18 | Ht 73.0 in | Wt 162.2 lb

## 2024-05-05 DIAGNOSIS — C9000 Multiple myeloma not having achieved remission: Secondary | ICD-10-CM

## 2024-05-05 LAB — MULTIPLE MYELOMA PANEL, SERUM
Albumin SerPl Elph-Mcnc: 3.7 g/dL (ref 2.9–4.4)
Albumin/Glob SerPl: 0.5 — ABNORMAL LOW (ref 0.7–1.7)
Alpha 1: 0.3 g/dL (ref 0.0–0.4)
Alpha2 Glob SerPl Elph-Mcnc: 0.7 g/dL (ref 0.4–1.0)
B-Globulin SerPl Elph-Mcnc: 6 g/dL — ABNORMAL HIGH (ref 0.7–1.3)
Gamma Glob SerPl Elph-Mcnc: 0.9 g/dL (ref 0.4–1.8)
Globulin, Total: 7.9 g/dL — ABNORMAL HIGH (ref 2.2–3.9)
IgA: >6400 mg/dL — ABNORMAL HIGH (ref 61–437)
IgG (Immunoglobin G), Serum: 253 mg/dL — ABNORMAL LOW (ref 603–1613)
IgM (Immunoglobulin M), Srm: <5 mg/dL — ABNORMAL LOW (ref 20–172)
M Protein SerPl Elph-Mcnc: 5 g/dL — ABNORMAL HIGH
Total Protein ELP: 11.6 g/dL — ABNORMAL HIGH (ref 6.0–8.5)

## 2024-05-05 MED ORDER — METHYLPREDNISOLONE 4 MG PO TBPK
ORAL_TABLET | ORAL | 0 refills | Status: AC
  Filled 2024-05-05: qty 21, 6d supply, fill #0

## 2024-05-05 MED ORDER — OXYCODONE HCL 10 MG PO TABS
10.0000 mg | ORAL_TABLET | ORAL | 0 refills | Status: AC | PRN
  Filled 2024-05-05: qty 120, 14d supply, fill #0

## 2024-05-05 NOTE — Telephone Encounter (Signed)
 Called pt to get scheduled for patient edu

## 2024-05-05 NOTE — Progress Notes (Signed)
 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 Rodolph Johnston Police PA 05/01/2024 --Urgent referral to radiation oncology to assess for palliative radiation to scapular mass.  --Plan for chemoeducation and tentative plan to start in 1-2 weeks.     Pain on a scale of 0-10 is: 10/10 pain to the right shoulder/top of back.  He is having to sleep in recliner or propped up on the couch.  He is taking Oxycodone  5 mg 1 tablet every 4 hours, just started taking 2 tablets q4 in the last day or so.    Ambulatory status? Walker? Wheelchair?: Ambulatory  SAFETY ISSUES: Prior radiation? No Pacemaker/ICD? No Possible current pregnancy? N/a Is the patient on methotrexate? No  Current Complaints / other details:

## 2024-05-06 ENCOUNTER — Other Ambulatory Visit: Payer: Self-pay

## 2024-05-06 ENCOUNTER — Telehealth: Payer: Self-pay

## 2024-05-06 NOTE — Telephone Encounter (Signed)
 Spoke with the pt spouse in regard to her FMLA form being completed and ready for pick. Pt verbalized understanding.

## 2024-05-06 NOTE — Progress Notes (Signed)
 Per Sharene Cary, RN: Mr. Arviso wanted know if his OxyIR could be refilled and also if there was something else he could take as this is not working well for him. He has been taking 5mg  q4 and has upped it to 10 mg q4 over the last couple of days.   Spoke with pt's wife and told her to call back on Thursday or Friday to let us  know how he is doing on the Medrol  dose pack and increased dose of Oxy 10 mg written by Isaiah Husband, PA

## 2024-05-07 ENCOUNTER — Other Ambulatory Visit: Payer: Self-pay | Admitting: Physician Assistant

## 2024-05-07 ENCOUNTER — Encounter: Payer: Self-pay | Admitting: Hematology and Oncology

## 2024-05-07 ENCOUNTER — Other Ambulatory Visit: Payer: Self-pay | Admitting: *Deleted

## 2024-05-07 ENCOUNTER — Other Ambulatory Visit: Payer: Self-pay | Admitting: Hematology and Oncology

## 2024-05-07 DIAGNOSIS — C9000 Multiple myeloma not having achieved remission: Secondary | ICD-10-CM

## 2024-05-07 MED ORDER — ONDANSETRON HCL 8 MG PO TABS: 8.0000 mg | ORAL_TABLET | Freq: Three times a day (TID) | ORAL | 3 refills | Status: AC | PRN

## 2024-05-07 MED ORDER — ACYCLOVIR 400 MG PO TABS: 400.0000 mg | ORAL_TABLET | Freq: Two times a day (BID) | ORAL | 2 refills | Status: AC

## 2024-05-07 MED ORDER — PROCHLORPERAZINE MALEATE 10 MG PO TABS: 10.0000 mg | ORAL_TABLET | Freq: Four times a day (QID) | ORAL | 2 refills | Status: AC | PRN

## 2024-05-07 MED ORDER — DEXAMETHASONE 4 MG PO TABS: 40.0000 mg | ORAL_TABLET | ORAL | 3 refills | Status: AC

## 2024-05-08 ENCOUNTER — Telehealth: Payer: Self-pay | Admitting: Hematology and Oncology

## 2024-05-08 ENCOUNTER — Inpatient Hospital Stay: Attending: Physician Assistant

## 2024-05-08 NOTE — Progress Notes (Signed)
 Pharmacist Chemotherapy Monitoring - Initial Assessment    Anticipated start date: 05/15/2024   The following has been reviewed per standard work regarding the patient's treatment regimen: The patient's diagnosis, treatment plan and drug doses, and organ/hematologic function Lab orders and baseline tests specific to treatment regimen  The treatment plan start date, drug sequencing, and pre-medications Prior authorization status  Patient's documented medication list, including drug-drug interaction screen and prescriptions for anti-emetics and supportive care specific to the treatment regimen The drug concentrations, fluid compatibility, administration routes, and timing of the medications to be used The patient's access for treatment and lifetime cumulative dose history, if applicable  The patient's medication allergies and previous infusion related reactions, if applicable   Changes made to treatment plan:  N/A  Follow up needed:  ABO/Rh testing in 2017 and recent T & S   Adam [name], RPH, 05/08/2024  11:55 AM

## 2024-05-08 NOTE — Telephone Encounter (Signed)
Pt called about appt time.

## 2024-05-11 ENCOUNTER — Ambulatory Visit: Admitting: Radiation Oncology

## 2024-05-12 ENCOUNTER — Ambulatory Visit

## 2024-05-13 ENCOUNTER — Ambulatory Visit

## 2024-05-13 ENCOUNTER — Inpatient Hospital Stay: Attending: Physician Assistant

## 2024-05-14 ENCOUNTER — Ambulatory Visit

## 2024-05-15 ENCOUNTER — Ambulatory Visit

## 2024-05-15 ENCOUNTER — Inpatient Hospital Stay

## 2024-05-15 ENCOUNTER — Inpatient Hospital Stay: Admitting: Hematology and Oncology

## 2024-05-18 ENCOUNTER — Ambulatory Visit

## 2024-05-19 ENCOUNTER — Ambulatory Visit

## 2024-05-20 ENCOUNTER — Ambulatory Visit

## 2024-05-21 ENCOUNTER — Ambulatory Visit

## 2024-05-22 ENCOUNTER — Inpatient Hospital Stay: Admitting: Physician Assistant

## 2024-05-22 ENCOUNTER — Inpatient Hospital Stay

## 2024-05-22 ENCOUNTER — Ambulatory Visit

## 2024-05-29 ENCOUNTER — Inpatient Hospital Stay: Admitting: Hematology and Oncology

## 2024-05-29 ENCOUNTER — Inpatient Hospital Stay

## 2024-06-05 ENCOUNTER — Inpatient Hospital Stay: Attending: Physician Assistant

## 2024-06-05 ENCOUNTER — Inpatient Hospital Stay

## 2024-06-05 ENCOUNTER — Inpatient Hospital Stay: Admitting: Physician Assistant

## 2024-06-12 ENCOUNTER — Inpatient Hospital Stay

## 2024-06-12 ENCOUNTER — Inpatient Hospital Stay: Admitting: Hematology and Oncology
